# Patient Record
Sex: Male | Born: 1975 | Race: White | Hispanic: No | Marital: Single | Smoking: Current every day smoker
Health system: Southern US, Community
[De-identification: ages and names within clinical notes are randomized; demographics above are authoritative.]

## PROBLEM LIST (undated history)

## (undated) DIAGNOSIS — E1110 Type 2 diabetes mellitus with ketoacidosis without coma: Secondary | ICD-10-CM

## (undated) DIAGNOSIS — E1165 Type 2 diabetes mellitus with hyperglycemia: Secondary | ICD-10-CM

## (undated) DIAGNOSIS — Z789 Other specified health status: Secondary | ICD-10-CM

## (undated) DIAGNOSIS — F1010 Alcohol abuse, uncomplicated: Secondary | ICD-10-CM

## (undated) HISTORY — DX: Type 2 diabetes mellitus with ketoacidosis without coma: E11.10

## (undated) HISTORY — PX: ANKLE SURGERY: SHX546

---

## 2015-11-24 ENCOUNTER — Ambulatory Visit: Admission: EM | Admit: 2015-11-24 | Discharge: 2015-11-24 | Discharge status: Absent without leave | Payer: Self-pay

## 2017-05-17 ENCOUNTER — Ambulatory Visit: Payer: Self-pay | Admitting: Family Medicine

## 2017-05-18 ENCOUNTER — Ambulatory Visit: Payer: Self-pay | Admitting: Family Medicine

## 2018-09-03 ENCOUNTER — Other Ambulatory Visit: Payer: Self-pay

## 2018-09-03 ENCOUNTER — Encounter: Payer: Self-pay | Admitting: Internal Medicine

## 2018-09-03 ENCOUNTER — Emergency Department: Payer: Self-pay

## 2018-09-03 ENCOUNTER — Inpatient Hospital Stay
Admission: EM | Admit: 2018-09-03 | Discharge: 2018-09-06 | DRG: 638 | Disposition: A | Discharge status: Home / Self Care | Payer: Self-pay | Attending: Internal Medicine | Admitting: Internal Medicine

## 2018-09-03 DIAGNOSIS — E8881 Metabolic syndrome: Secondary | ICD-10-CM | POA: Diagnosis present

## 2018-09-03 DIAGNOSIS — K760 Fatty (change of) liver, not elsewhere classified: Secondary | ICD-10-CM | POA: Diagnosis present

## 2018-09-03 DIAGNOSIS — F1010 Alcohol abuse, uncomplicated: Secondary | ICD-10-CM | POA: Diagnosis present

## 2018-09-03 DIAGNOSIS — R112 Nausea with vomiting, unspecified: Secondary | ICD-10-CM | POA: Diagnosis present

## 2018-09-03 DIAGNOSIS — R739 Hyperglycemia, unspecified: Secondary | ICD-10-CM | POA: Diagnosis present

## 2018-09-03 DIAGNOSIS — E785 Hyperlipidemia, unspecified: Secondary | ICD-10-CM | POA: Diagnosis present

## 2018-09-03 DIAGNOSIS — I2510 Atherosclerotic heart disease of native coronary artery without angina pectoris: Secondary | ICD-10-CM | POA: Diagnosis present

## 2018-09-03 DIAGNOSIS — E876 Hypokalemia: Secondary | ICD-10-CM | POA: Diagnosis present

## 2018-09-03 DIAGNOSIS — Z23 Encounter for immunization: Secondary | ICD-10-CM

## 2018-09-03 DIAGNOSIS — R748 Abnormal levels of other serum enzymes: Secondary | ICD-10-CM

## 2018-09-03 DIAGNOSIS — R740 Nonspecific elevation of levels of transaminase and lactic acid dehydrogenase [LDH]: Secondary | ICD-10-CM

## 2018-09-03 DIAGNOSIS — F1021 Alcohol dependence, in remission: Secondary | ICD-10-CM | POA: Diagnosis present

## 2018-09-03 DIAGNOSIS — E1110 Type 2 diabetes mellitus with ketoacidosis without coma: Principal | ICD-10-CM | POA: Diagnosis present

## 2018-09-03 DIAGNOSIS — Z6841 Body Mass Index (BMI) 40.0 and over, adult: Secondary | ICD-10-CM

## 2018-09-03 DIAGNOSIS — R1013 Epigastric pain: Secondary | ICD-10-CM

## 2018-09-03 DIAGNOSIS — R7401 Elevation of levels of liver transaminase levels: Secondary | ICD-10-CM | POA: Diagnosis present

## 2018-09-03 DIAGNOSIS — I10 Essential (primary) hypertension: Secondary | ICD-10-CM | POA: Diagnosis present

## 2018-09-03 DIAGNOSIS — Z87891 Personal history of nicotine dependence: Secondary | ICD-10-CM

## 2018-09-03 DIAGNOSIS — E872 Acidosis: Secondary | ICD-10-CM | POA: Diagnosis present

## 2018-09-03 DIAGNOSIS — F419 Anxiety disorder, unspecified: Secondary | ICD-10-CM | POA: Diagnosis present

## 2018-09-03 HISTORY — DX: Alcohol abuse, uncomplicated: F10.10

## 2018-09-03 HISTORY — DX: Type 2 diabetes mellitus with hyperglycemia: E11.65

## 2018-09-03 HISTORY — DX: Other specified health status: Z78.9

## 2018-09-03 HISTORY — DX: Morbid (severe) obesity due to excess calories: E66.01

## 2018-09-03 LAB — BASIC METABOLIC PANEL
ANION GAP: 21 — AB (ref 5–15)
BUN: <5 mg/dL — ABNORMAL LOW (ref 6–20)
CALCIUM: 10 mg/dL (ref 8.9–10.3)
CO2: 24 mmol/L (ref 22–32)
Chloride: 90 mmol/L — ABNORMAL LOW (ref 98–111)
Creatinine, Ser: 0.59 mg/dL — ABNORMAL LOW (ref 0.61–1.24)
GFR calc Af Amer: >60 mL/min (ref >60)
Glucose, Bld: 343 mg/dL — ABNORMAL HIGH (ref 70–99)
POTASSIUM: 2.7 mmol/L — AB (ref 3.5–5.1)
SODIUM: 135 mmol/L (ref 135–145)

## 2018-09-03 LAB — BLOOD GAS, VENOUS
ACID-BASE EXCESS: 3.9 mmol/L — AB (ref 0.0–2.0)
BICARBONATE: 26.1 mmol/L (ref 20.0–28.0)
FIO2: 0.21
O2 Saturation: 91.9 %
PCO2 VEN: 32 mmHg — AB (ref 44.0–60.0)
PH VEN: 7.52 — AB (ref 7.250–7.430)
Patient temperature: 37
pO2, Ven: 56 mmHg — ABNORMAL HIGH (ref 32.0–45.0)

## 2018-09-03 LAB — URINALYSIS, COMPLETE (UACMP) WITH MICROSCOPIC
Bacteria, UA: NONE SEEN
Bilirubin Urine: NEGATIVE
Glucose, UA: >=500 mg/dL — AB
Ketones, ur: 20 mg/dL — AB
Leukocytes, UA: NEGATIVE
NITRITE: NEGATIVE
Protein, ur: 30 mg/dL — AB
Specific Gravity, Urine: 1.029 (ref 1.005–1.030)
pH: 7 (ref 5.0–8.0)

## 2018-09-03 LAB — CBC
HCT: 47.6 % (ref 39.0–52.0)
HEMOGLOBIN: 16.7 g/dL (ref 13.0–17.0)
MCH: 31.5 pg (ref 26.0–34.0)
MCHC: 35.1 g/dL (ref 30.0–36.0)
MCV: 89.8 fL (ref 80.0–100.0)
Platelets: 152 10*3/uL (ref 150–400)
RBC: 5.3 MIL/uL (ref 4.22–5.81)
RDW: 13.5 % (ref 11.5–15.5)
WBC: 7.5 10*3/uL (ref 4.0–10.5)
nRBC: 0 % (ref 0.0–0.2)

## 2018-09-03 LAB — HEPATIC FUNCTION PANEL
ALBUMIN: 4.1 g/dL (ref 3.5–5.0)
ALT: 117 U/L — ABNORMAL HIGH (ref 0–44)
AST: 229 U/L — AB (ref 15–41)
Alkaline Phosphatase: 83 U/L (ref 38–126)
Bilirubin, Direct: 1.4 mg/dL — ABNORMAL HIGH (ref 0.0–0.2)
Indirect Bilirubin: 1.2 mg/dL — ABNORMAL HIGH (ref 0.3–0.9)
TOTAL PROTEIN: 8.6 g/dL — AB (ref 6.5–8.1)
Total Bilirubin: 2.6 mg/dL — ABNORMAL HIGH (ref 0.3–1.2)

## 2018-09-03 LAB — GLUCOSE, CAPILLARY
GLUCOSE-CAPILLARY: 321 mg/dL — AB (ref 70–99)
Glucose-Capillary: 272 mg/dL — ABNORMAL HIGH (ref 70–99)

## 2018-09-03 LAB — LIPASE, BLOOD: Lipase: 64 U/L — ABNORMAL HIGH (ref 11–51)

## 2018-09-03 LAB — CG4 I-STAT (LACTIC ACID): Lactic Acid, Venous: 5.51 mmol/L (ref 0.5–1.9)

## 2018-09-03 MED ORDER — ONDANSETRON HCL 4 MG/2ML IJ SOLN
INTRAMUSCULAR | Status: AC
  Administered 2018-09-03: 4 mg via INTRAVENOUS
  Filled 2018-09-03: qty 2

## 2018-09-03 MED ORDER — LORAZEPAM 2 MG/ML IJ SOLN
1.0000 mg | Freq: Once | INTRAMUSCULAR | Status: AC
  Administered 2018-09-03: 1 mg via INTRAVENOUS
  Filled 2018-09-03: qty 1

## 2018-09-03 MED ORDER — THIAMINE HCL 100 MG/ML IJ SOLN
Freq: Once | INTRAVENOUS | Status: AC
  Administered 2018-09-03: 23:00:00 via INTRAVENOUS
  Filled 2018-09-03: qty 1000

## 2018-09-03 MED ORDER — LORAZEPAM 2 MG/ML IJ SOLN
2.0000 mg | Freq: Once | INTRAMUSCULAR | Status: AC
  Administered 2018-09-03: 2 mg via INTRAVENOUS

## 2018-09-03 MED ORDER — POTASSIUM CHLORIDE CRYS ER 20 MEQ PO TBCR
40.0000 meq | EXTENDED_RELEASE_TABLET | Freq: Once | ORAL | Status: AC
  Administered 2018-09-03: 40 meq via ORAL
  Filled 2018-09-03: qty 2

## 2018-09-03 MED ORDER — ONDANSETRON HCL 4 MG/2ML IJ SOLN
4.0000 mg | Freq: Once | INTRAMUSCULAR | Status: AC
  Administered 2018-09-03: 4 mg via INTRAVENOUS

## 2018-09-03 MED ORDER — SODIUM CHLORIDE 0.9 % IV BOLUS
500.0000 mL | Freq: Once | INTRAVENOUS | Status: AC
  Administered 2018-09-03: 500 mL via INTRAVENOUS

## 2018-09-03 MED ORDER — SODIUM CHLORIDE 0.9 % IV BOLUS
1000.0000 mL | Freq: Once | INTRAVENOUS | Status: AC
  Administered 2018-09-03: 1000 mL via INTRAVENOUS

## 2018-09-03 MED ORDER — LORAZEPAM 2 MG/ML IJ SOLN
INTRAMUSCULAR | Status: AC
  Filled 2018-09-03: qty 1

## 2018-09-03 NOTE — H&P (Signed)
[facility] [place] at [facility]   PATIENT NAME: Cory MoralesRyan Snyder    MR#:  [id number]  DATE OF BIRTH:  1976-07-26  DATE OF ADMISSION:  09/03/2018  PRIMARY CARE PHYSICIAN: Patient, No Pcp Per   REQUESTING/REFERRING PHYSICIAN: Derrill KayGoodman, MD  CHIEF COMPLAINT:   Chief Complaint  Patient presents with  . Hyperglycemia  . Emesis    HISTORY OF PRESENT ILLNESS:  Cory MoralesRyan Snyder  is a 42 y.o. male who presents with chief complaint as above.  Patient states he has been having nausea with vomiting for the past 2 weeks.  He states he was diagnosed with diabetes about 2 months ago, but has not been able to establish with a PCP yet and so ran out of the initial metformin he was given about 4 weeks ago.  His nausea with vomiting has gotten significant he worse over the past 2 days.  He is also a heavy drinker, drinking about 1/5 of alcohol every day.  His last drink was yesterday.  In the ED tonight he was found to be hyperglycemic, has elevated transaminases, hypokalemic, and with an elevated lactic acid.  Infectious work-up is largely unrevealing.  Hospitalist were called for admission  PAST MEDICAL HISTORY:   Past Medical History:  Diagnosis Date  . Patient denies medical problems      PAST SURGICAL HISTORY:   Past Surgical History:  Procedure Laterality Date  . ANKLE SURGERY       SOCIAL HISTORY:   Social History   Tobacco Use  . Smoking status: Former Smoker  Substance Use Topics  . Alcohol use: Yes    Comment: 1/5 daily     FAMILY HISTORY:   Family History  Problem Relation Age of Onset  . CAD Other      DRUG ALLERGIES:  No Known Allergies  MEDICATIONS AT HOME:   Prior to Admission medications   Not on File    REVIEW OF SYSTEMS:  Review of Systems  Constitutional: Positive for malaise/fatigue. Negative for chills, fever and weight loss.  HENT: Negative for ear pain, hearing loss and tinnitus.   Eyes: Negative for blurred vision,  double vision, pain and redness.  Respiratory: Negative for cough, hemoptysis and shortness of breath.   Cardiovascular: Negative for chest pain, palpitations, orthopnea and leg swelling.  Gastrointestinal: Positive for nausea and vomiting. Negative for abdominal pain, constipation and diarrhea.  Genitourinary: Negative for dysuria, frequency and hematuria.  Musculoskeletal: Negative for back pain, joint pain and neck pain.  Skin:       No acne, rash, or lesions  Neurological: Negative for dizziness, tremors, focal weakness and weakness.  Endo/Heme/Allergies: Negative for polydipsia. Does not bruise/bleed easily.  Psychiatric/Behavioral: Negative for depression. The patient is not nervous/anxious and does not have insomnia.      VITAL SIGNS:   Vitals:   09/03/18 1827 09/03/18 1900 09/03/18 2000 09/03/18 2330  BP:  (!) 160/83 (!) 152/95 (!) 168/88  Pulse:  (!) 102 (!) 108 65  Resp:      Temp:      TempSrc:      SpO2:  92% 97% 93%  Weight: (!) 145.2 kg     Height: 6' (1.829 m)      Wt Readings from Last 3 Encounters:  09/03/18 (!) 145.2 kg    PHYSICAL EXAMINATION:  Physical Exam  Vitals reviewed. Constitutional: He is oriented to person, place, and time. He appears well-developed and well-nourished. No distress.  HENT:  Head: Normocephalic and  atraumatic.  Mouth/Throat: Oropharynx is clear and moist.  Eyes: Pupils are equal, round, and reactive to light. Conjunctivae and EOM are normal. No scleral icterus.  Neck: Normal range of motion. Neck supple. No JVD present. No thyromegaly present.  Cardiovascular: Regular rhythm and intact distal pulses. Exam reveals no gallop and no friction rub.  No murmur heard. Tachycardic  Respiratory: Effort normal and breath sounds normal. No respiratory distress. He has no wheezes. He has no rales.  GI: Soft. Bowel sounds are normal. He exhibits no distension. There is tenderness.  Musculoskeletal: Normal range of motion. He exhibits no  edema.  No arthritis, no gout  Lymphadenopathy:    He has no cervical adenopathy.  Neurological: He is alert and oriented to person, place, and time. No cranial nerve deficit.  No dysarthria, no aphasia  Skin: Skin is warm and dry. No rash noted. No erythema.  Psychiatric: He has a normal mood and affect. His behavior is normal. Judgment and thought content normal.    LABORATORY PANEL:   CBC Recent Labs  Lab 09/03/18 1833  WBC 7.5  HGB 16.7  HCT 47.6  PLT 152   ------------------------------------------------------------------------------------------------------------------  Chemistries  Recent Labs  Lab 09/03/18 1833  NA 135  K 2.7*  CL 90*  CO2 24  GLUCOSE 343*  BUN <5*  CREATININE 0.59*  CALCIUM 10.0  AST 229*  ALT 117*  ALKPHOS 83  BILITOT 2.6*   ------------------------------------------------------------------------------------------------------------------  Cardiac Enzymes No results for input(s): TROPONINI in the last 168 hours. ------------------------------------------------------------------------------------------------------------------  RADIOLOGY:  US Abdomen Limited Ruq  Result Date: 09/03/2018 CLINICAL DATA:  Epigastric pain EXAM: ULTRASOUND ABDOMEN LIMITED RIGHT UPPER QUADRANT COMPARISON:  None. FINDINGS: Gallbladder: No gallstones or wall thickening visualized. No sonographic Murphy sign noted by sonographer. Common bile duct: Diameter: Normal caliber, 4 mm Liver: Increased echotexture compatible with fatty infiltration. No focal abnormality or biliary ductal dilatation. Portal vein is patent on color Doppler imaging with normal direction of blood flow towards the liver. IMPRESSION: Fatty infiltration of the liver.  No acute findings. Electronically Signed   By: Charlett Nose M.D.   On: 09/03/2018 22:17    EKG:   Orders placed or performed during the hospital encounter of 09/03/18  . EKG 12-Lead  . EKG 12-Lead  . ED EKG  . ED EKG     IMPRESSION AND PLAN:  Principal Problem:   DKA (diabetic ketoacidoses) (HCC) -admit to stepdown unit with IV insulin and corresponding orders as per DKA order set Active Problems:   Transaminitis -AST to ALT ratio around 2-1.  Suspect his transaminitis is due to his alcohol use, but given that he has had some frequent nausea and vomiting recently we will also check an acute hepatitis panel   Nausea & vomiting -some suspicion that this could be related to gastroparesis.  However, we will check hepatitis panel as above.  PRN antiemetics   Hypokalemia -replace potassium and monitor   Alcohol abuse -CIWA protocol  Chart review performed and case discussed with ED provider. Labs, imaging and/or ECG reviewed by provider and discussed with patient/family. Management plans discussed with the patient and/or family.  DVT PROPHYLAXIS: SubQ lovenox   GI PROPHYLAXIS:  None  ADMISSION STATUS: Inpatient     CODE STATUS: Full  TOTAL TIME TAKING CARE OF THIS PATIENT: 45 minutes.   Carrin Vannostrand FIELDING 09/03/2018, 11:52 PM  [employer]  [phone]  CC: Primary care physician; Patient, No Pcp Per  Note:  This document  was prepared using Systems analyst and may include unintentional dictation errors.

## 2018-09-03 NOTE — ED Triage Notes (Signed)
Pt arrived via POV with reports of several days of poor appetite, vomiting and increased thirst. Pt recent dx diabetic, not currently on meds.   Pt diaphoretic in triage.  C/o not feeling well, wife at bedside.

## 2018-09-03 NOTE — ED Notes (Signed)
Date and time results received: 09/03/18 1910 (use smartphrase ".now" to insert current time)  Test: Potassium Critical Value: 2.7  Name of Provider Notified: Derrill KayGoodman   Orders Received? Or Actions Taken?: No orders received

## 2018-09-03 NOTE — ED Notes (Signed)
Pt reports feeling like he is going to pass out, pt profusely diaphoretic, wife fanning pt, IV access established and fluids to be started.

## 2018-09-03 NOTE — ED Notes (Signed)
N/V/D PTA. Denies symptoms at this time.

## 2018-09-03 NOTE — ED Provider Notes (Signed)
[facility] Emergency Department Provider Note  ____________________________________________   I have reviewed the triage vital signs and the nursing notes.   HISTORY  Chief Complaint Hyperglycemia and Emesis   History limited by: Not Limited   HPI Cory MoralesRyan Snyder is a 42 y.o. male who presents to the emergency department today because of concerns for feeling unwell, nausea vomiting and abdominal pain.  He states his symptoms have been going on for the past couple of weeks.  He states the abdominal pain is located in the central and right upper abdomen.  He has multiple episodes of vomiting each day.  This has been nonbloody.  He does state that he has a significant alcohol use.  Drinks roughly 1/5 a day.  He denies any fevers.  Was recently diagnosed with diabetes but is been out of his metformin for the past month.   Per medical record review patient has a history of diabetes  No past medical history on file.  There are no active problems to display for this patient.   Prior to Admission medications   Not on File    Allergies Patient has no known allergies.  No family history on file.  Social History Social History   Tobacco Use  . Smoking status: Not on file  Substance Use Topics  . Alcohol use: Not on file  . Drug use: Not on file    Review of Systems Constitutional: No fever/chills Eyes: No visual changes. ENT: No sore throat. Cardiovascular: Denies chest pain. Respiratory: Denies shortness of breath. Gastrointestinal: Positive for abdominal pain, nausea and vomiting.  Genitourinary: Negative for dysuria. Musculoskeletal: Negative for back pain. Skin: Negative for rash. Neurological: Negative for headaches, focal weakness or numbness.  ____________________________________________   PHYSICAL EXAM:  VITAL SIGNS: ED Triage Vitals  Enc Vitals Group     BP 09/03/18 1826 (!) 160/84     Pulse Rate 09/03/18 1826 64     Resp  09/03/18 1826 (!) 22     Temp 09/03/18 1826 (!) 97.5 F (36.4 C)     Temp Source 09/03/18 1826 Oral     SpO2 09/03/18 1826 97 %     Weight 09/03/18 1827 (!) 320 lb (145.2 kg)     Height 09/03/18 1827 6' (1.829 m)     Head Circumference --      Peak Flow --      Pain Score 09/03/18 1827 0   Constitutional: Alert and oriented.  Eyes: Conjunctivae are normal.  ENT      Head: Normocephalic and atraumatic.      Nose: No congestion/rhinnorhea.      Mouth/Throat: Mucous membranes are moist.      Neck: No stridor. Hematological/Lymphatic/Immunilogical: No cervical lymphadenopathy. Cardiovascular: Normal rate, regular rhythm.  No murmurs, rubs, or gallops.  Respiratory: Normal respiratory effort without tachypnea nor retractions. Breath sounds are clear and equal bilaterally. No wheezes/rales/rhonchi. Gastrointestinal: Soft and non tender. No rebound. No guarding.  Genitourinary: Deferred Musculoskeletal: Normal range of motion in all extremities. No lower extremity edema. Neurologic:  Normal speech and language. No gross focal neurologic deficits are appreciated.  Skin:  Skin is warm, dry and intact. No rash noted. Psychiatric: Mood and affect are normal. Speech and behavior are normal. Patient exhibits appropriate insight and judgment.  ____________________________________________    LABS (pertinent positives/negatives)  CBC wbc 7.5, hgb 16.7, plt 152 VBG pH 7.52 I stat lactic 5.51 BMP na 135, k 2.7, glu 343, bun <5, cr 0.59, anion gap  21 ____________________________________________   EKG  IPhineas Semen, attending physician, personally viewed and interpreted this EKG  EKG Time: 1820 Rate: 118 Rhythm: sinus tachycardia with pac Axis: normal Intervals: qtc 518 QRS: q waves III ST changes: no st elevation Impression: abnormal ekg  ____________________________________________    RADIOLOGY  [place] RUQ Fatty  liver  ____________________________________________   PROCEDURES  Procedures  ____________________________________________   INITIAL IMPRESSION / ASSESSMENT AND PLAN / ED COURSE  Pertinent labs & imaging results that were available during my care of the patient were reviewed by me and considered in my medical decision making (see chart for details).   Patient presented to the emergency department today because of concern for feeling unwell nausea and abdominal pain. Patient has somewhat recent diagnosis of diabetes but currently is not on medication. Patient blood work shows elevated lactic, blood glucose and anion gap. At this point doubt DKA given alkalosis. However patient's potassium is low and will need repletion prior to initiating any insulin. Patient does state he is a heavy drinker and this could be causing some of the blood abnormalities. His LFTs were elevated. Doubt sepsis at this time given lack of any infectious source. Will plan on admission. ___________________________________   FINAL CLINICAL IMPRESSION(S) / ED DIAGNOSES  Final diagnoses:  Epigastric pain  Elevated liver enzymes     Note: This dictation was prepared with Dragon dictation. Any transcriptional errors that result from this process are unintentional     Phineas Semen, MD 09/04/18 1521

## 2018-09-04 ENCOUNTER — Encounter: Payer: Self-pay | Admitting: Adult Health

## 2018-09-04 ENCOUNTER — Telehealth: Payer: Self-pay | Admitting: Pharmacy Technician

## 2018-09-04 DIAGNOSIS — E1110 Type 2 diabetes mellitus with ketoacidosis without coma: Secondary | ICD-10-CM | POA: Diagnosis present

## 2018-09-04 DIAGNOSIS — E1111 Type 2 diabetes mellitus with ketoacidosis with coma: Secondary | ICD-10-CM

## 2018-09-04 HISTORY — DX: Type 2 diabetes mellitus with ketoacidosis without coma: E11.10

## 2018-09-04 LAB — BASIC METABOLIC PANEL
Anion gap: 13 (ref 5–15)
Anion gap: 11 (ref 5–15)
CHLORIDE: 95 mmol/L — AB (ref 98–111)
CO2: 27 mmol/L (ref 22–32)
CO2: 28 mmol/L (ref 22–32)
CREATININE: 0.47 mg/dL — AB (ref 0.61–1.24)
CREATININE: 0.53 mg/dL — AB (ref 0.61–1.24)
Calcium: 8.6 mg/dL — ABNORMAL LOW (ref 8.9–10.3)
Calcium: 8.6 mg/dL — ABNORMAL LOW (ref 8.9–10.3)
Chloride: 96 mmol/L — ABNORMAL LOW (ref 98–111)
GFR calc Af Amer: >60 mL/min (ref >60)
GFR calc Af Amer: >60 mL/min (ref >60)
Glucose, Bld: 242 mg/dL — ABNORMAL HIGH (ref 70–99)
Glucose, Bld: 217 mg/dL — ABNORMAL HIGH (ref 70–99)
POTASSIUM: 2.7 mmol/L — AB (ref 3.5–5.1)
Potassium: 2.7 mmol/L — CL (ref 3.5–5.1)
SODIUM: 135 mmol/L (ref 135–145)
SODIUM: 135 mmol/L (ref 135–145)

## 2018-09-04 LAB — LIPID PANEL
CHOL/HDL RATIO: 4.1 ratio
Cholesterol: 330 mg/dL — ABNORMAL HIGH (ref 0–200)
HDL: 80 mg/dL (ref >40)
LDL Cholesterol: 220 mg/dL — ABNORMAL HIGH (ref 0–99)
Triglycerides: 150 mg/dL — ABNORMAL HIGH (ref <150)
VLDL: 30 mg/dL (ref 0–40)

## 2018-09-04 LAB — COMPREHENSIVE METABOLIC PANEL
ALT: 96 U/L — AB (ref 0–44)
AST: 178 U/L — ABNORMAL HIGH (ref 15–41)
Albumin: 3.7 g/dL (ref 3.5–5.0)
Alkaline Phosphatase: 70 U/L (ref 38–126)
Anion gap: 18 — ABNORMAL HIGH (ref 5–15)
BUN: <5 mg/dL — ABNORMAL LOW (ref 6–20)
CHLORIDE: 93 mmol/L — AB (ref 98–111)
CO2: 24 mmol/L (ref 22–32)
CREATININE: 0.5 mg/dL — AB (ref 0.61–1.24)
Calcium: 9 mg/dL (ref 8.9–10.3)
GFR calc non Af Amer: >60 mL/min (ref >60)
Glucose, Bld: 250 mg/dL — ABNORMAL HIGH (ref 70–99)
Potassium: 2.9 mmol/L — ABNORMAL LOW (ref 3.5–5.1)
SODIUM: 135 mmol/L (ref 135–145)
Total Bilirubin: 3.1 mg/dL — ABNORMAL HIGH (ref 0.3–1.2)
Total Protein: 7.5 g/dL (ref 6.5–8.1)

## 2018-09-04 LAB — CBC
HCT: 42.8 % (ref 39.0–52.0)
HEMOGLOBIN: 14.6 g/dL (ref 13.0–17.0)
MCH: 31.3 pg (ref 26.0–34.0)
MCHC: 34.1 g/dL (ref 30.0–36.0)
MCV: 91.8 fL (ref 80.0–100.0)
Platelets: 103 10*3/uL — ABNORMAL LOW (ref 150–400)
RBC: 4.66 MIL/uL (ref 4.22–5.81)
RDW: 13.7 % (ref 11.5–15.5)
WBC: 6.3 10*3/uL (ref 4.0–10.5)
nRBC: 0 % (ref 0.0–0.2)

## 2018-09-04 LAB — DIFFERENTIAL
BAND NEUTROPHILS: 0 %
Basophils Absolute: 0.1 10*3/uL (ref 0.0–0.1)
Basophils Relative: 1 %
Blasts: 0 %
EOS ABS: 0.2 10*3/uL (ref 0.0–0.5)
Eosinophils Relative: 2 %
LYMPHS PCT: 15 %
Lymphs Abs: 1.1 10*3/uL (ref 0.7–4.0)
METAMYELOCYTES PCT: 0 %
MONO ABS: 0.8 10*3/uL (ref 0.1–1.0)
Monocytes Relative: 11 %
Myelocytes: 0 %
NEUTROS ABS: 5.3 10*3/uL (ref 1.7–7.7)
NRBC: 0 /100{WBCs}
Neutrophils Relative %: 71 %
Other: 0 %
Promyelocytes Relative: 0 %

## 2018-09-04 LAB — POTASSIUM: Potassium: 3.1 mmol/L — ABNORMAL LOW (ref 3.5–5.1)

## 2018-09-04 LAB — GLUCOSE, CAPILLARY
GLUCOSE-CAPILLARY: 211 mg/dL — AB (ref 70–99)
GLUCOSE-CAPILLARY: 245 mg/dL — AB (ref 70–99)
GLUCOSE-CAPILLARY: 255 mg/dL — AB (ref 70–99)
Glucose-Capillary: 237 mg/dL — ABNORMAL HIGH (ref 70–99)
Glucose-Capillary: 205 mg/dL — ABNORMAL HIGH (ref 70–99)
Glucose-Capillary: 241 mg/dL — ABNORMAL HIGH (ref 70–99)
Glucose-Capillary: 186 mg/dL — ABNORMAL HIGH (ref 70–99)
Glucose-Capillary: 170 mg/dL — ABNORMAL HIGH (ref 70–99)
Glucose-Capillary: 271 mg/dL — ABNORMAL HIGH (ref 70–99)

## 2018-09-04 LAB — MRSA PCR SCREENING: MRSA by PCR: NEGATIVE

## 2018-09-04 LAB — MAGNESIUM: Magnesium: 1.3 mg/dL — ABNORMAL LOW (ref 1.7–2.4)

## 2018-09-04 LAB — PHOSPHORUS: Phosphorus: 1.7 mg/dL — ABNORMAL LOW (ref 2.5–4.6)

## 2018-09-04 LAB — LACTIC ACID, PLASMA
LACTIC ACID, VENOUS: 1.3 mmol/L (ref 0.5–1.9)
LACTIC ACID, VENOUS: 3 mmol/L — AB (ref 0.5–1.9)
Lactic Acid, Venous: 1.4 mmol/L (ref 0.5–1.9)

## 2018-09-04 LAB — HEMOGLOBIN A1C
HEMOGLOBIN A1C: 10.6 % — AB (ref 4.8–5.6)
MEAN PLASMA GLUCOSE: 257.52 mg/dL

## 2018-09-04 MED ORDER — SODIUM CHLORIDE 0.9 % IV SOLN: INTRAVENOUS | Status: AC

## 2018-09-04 MED ORDER — IBUPROFEN 400 MG PO TABS: 400.0000 mg | ORAL_TABLET | Freq: Four times a day (QID) | ORAL | Status: DC | PRN

## 2018-09-04 MED ORDER — DEXTROSE-NACL 5-0.45 % IV SOLN
INTRAVENOUS | Status: DC
  Administered 2018-09-04 (×3): via INTRAVENOUS

## 2018-09-04 MED ORDER — MAGNESIUM SULFATE 2 GM/50ML IV SOLN
2.0000 g | Freq: Once | INTRAVENOUS | Status: AC
  Administered 2018-09-04: 2 g via INTRAVENOUS
  Filled 2018-09-04: qty 50

## 2018-09-04 MED ORDER — SODIUM CHLORIDE 0.9 % IV SOLN
INTRAVENOUS | Status: AC
  Administered 2018-09-04: 02:00:00 via INTRAVENOUS

## 2018-09-04 MED ORDER — INSULIN GLARGINE 100 UNIT/ML ~~LOC~~ SOLN
10.0000 [IU] | Freq: Two times a day (BID) | SUBCUTANEOUS | Status: DC
  Administered 2018-09-04 – 2018-09-05 (×4): 10 [IU] via SUBCUTANEOUS
  Filled 2018-09-04 (×6): qty 0.1

## 2018-09-04 MED ORDER — INSULIN ASPART 100 UNIT/ML ~~LOC~~ SOLN: 0.0000 [IU] | Freq: Every day | SUBCUTANEOUS | Status: DC

## 2018-09-04 MED ORDER — LORAZEPAM 1 MG PO TABS
0.0000 mg | ORAL_TABLET | Freq: Four times a day (QID) | ORAL | Status: AC
  Administered 2018-09-05 (×2): 1 mg via ORAL
  Filled 2018-09-04 (×2): qty 1

## 2018-09-04 MED ORDER — INFLUENZA VAC SPLIT QUAD 0.5 ML IM SUSY
0.5000 mL | PREFILLED_SYRINGE | INTRAMUSCULAR | Status: AC
  Administered 2018-09-05: 0.5 mL via INTRAMUSCULAR
  Filled 2018-09-04: qty 0.5

## 2018-09-04 MED ORDER — INSULIN ASPART 100 UNIT/ML ~~LOC~~ SOLN
0.0000 [IU] | Freq: Three times a day (TID) | SUBCUTANEOUS | Status: DC
  Administered 2018-09-04: 5 [IU] via SUBCUTANEOUS
  Administered 2018-09-05: 1 [IU] via SUBCUTANEOUS
  Administered 2018-09-05 – 2018-09-06 (×3): 2 [IU] via SUBCUTANEOUS
  Administered 2018-09-06: 1 [IU] via SUBCUTANEOUS
  Filled 2018-09-04 (×6): qty 1

## 2018-09-04 MED ORDER — LORAZEPAM 1 MG PO TABS
1.0000 mg | ORAL_TABLET | Freq: Four times a day (QID) | ORAL | Status: DC | PRN
  Administered 2018-09-04 – 2018-09-05 (×3): 1 mg via ORAL
  Filled 2018-09-04 (×3): qty 1

## 2018-09-04 MED ORDER — FOLIC ACID 1 MG PO TABS
1.0000 mg | ORAL_TABLET | Freq: Every day | ORAL | Status: DC
  Administered 2018-09-04 – 2018-09-06 (×3): 1 mg via ORAL
  Filled 2018-09-04 (×3): qty 1

## 2018-09-04 MED ORDER — VITAMIN B-1 100 MG PO TABS
100.0000 mg | ORAL_TABLET | Freq: Every day | ORAL | Status: DC
  Administered 2018-09-04 – 2018-09-06 (×3): 100 mg via ORAL
  Filled 2018-09-04 (×3): qty 1

## 2018-09-04 MED ORDER — THIAMINE HCL 100 MG/ML IJ SOLN
100.0000 mg | Freq: Every day | INTRAMUSCULAR | Status: DC
  Filled 2018-09-04: qty 1

## 2018-09-04 MED ORDER — POTASSIUM PHOSPHATES 15 MMOLE/5ML IV SOLN
15.0000 mmol | Freq: Once | INTRAVENOUS | Status: AC
  Administered 2018-09-04: 15 mmol via INTRAVENOUS
  Filled 2018-09-04: qty 5

## 2018-09-04 MED ORDER — METOPROLOL TARTRATE 25 MG PO TABS
12.5000 mg | ORAL_TABLET | Freq: Two times a day (BID) | ORAL | Status: DC
  Administered 2018-09-04 – 2018-09-06 (×5): 12.5 mg via ORAL
  Filled 2018-09-04 (×5): qty 1

## 2018-09-04 MED ORDER — LIVING WELL WITH DIABETES BOOK
Freq: Once | Status: AC
  Administered 2018-09-04: 14:00:00
  Filled 2018-09-04: qty 1

## 2018-09-04 MED ORDER — ENOXAPARIN SODIUM 40 MG/0.4ML ~~LOC~~ SOLN
40.0000 mg | Freq: Two times a day (BID) | SUBCUTANEOUS | Status: DC
  Administered 2018-09-04 – 2018-09-06 (×2): 40 mg via SUBCUTANEOUS
  Filled 2018-09-04 (×3): qty 0.4

## 2018-09-04 MED ORDER — METOCLOPRAMIDE HCL 5 MG/ML IJ SOLN
5.0000 mg | Freq: Four times a day (QID) | INTRAMUSCULAR | Status: DC | PRN
  Administered 2018-09-04: 5 mg via INTRAVENOUS
  Filled 2018-09-04: qty 2

## 2018-09-04 MED ORDER — PNEUMOCOCCAL VAC POLYVALENT 25 MCG/0.5ML IJ INJ
0.5000 mL | INJECTION | INTRAMUSCULAR | Status: AC
  Administered 2018-09-05: 0.5 mL via INTRAMUSCULAR
  Filled 2018-09-04: qty 0.5

## 2018-09-04 MED ORDER — ADULT MULTIVITAMIN W/MINERALS CH
1.0000 | ORAL_TABLET | Freq: Every day | ORAL | Status: DC
  Administered 2018-09-04 – 2018-09-06 (×3): 1 via ORAL
  Filled 2018-09-04 (×3): qty 1

## 2018-09-04 MED ORDER — ONDANSETRON HCL 4 MG PO TABS: 4.0000 mg | ORAL_TABLET | Freq: Four times a day (QID) | ORAL | Status: DC | PRN

## 2018-09-04 MED ORDER — LORAZEPAM 1 MG PO TABS: 0.0000 mg | ORAL_TABLET | Freq: Two times a day (BID) | ORAL | Status: DC

## 2018-09-04 MED ORDER — ASPIRIN EC 81 MG PO TBEC
81.0000 mg | DELAYED_RELEASE_TABLET | Freq: Every day | ORAL | Status: DC
  Administered 2018-09-04 – 2018-09-06 (×3): 81 mg via ORAL
  Filled 2018-09-04 (×3): qty 1

## 2018-09-04 MED ORDER — POTASSIUM CHLORIDE 10 MEQ/100ML IV SOLN
10.0000 meq | INTRAVENOUS | Status: AC
  Administered 2018-09-04 (×4): 10 meq via INTRAVENOUS
  Filled 2018-09-04 (×4): qty 100

## 2018-09-04 MED ORDER — INSULIN ASPART 100 UNIT/ML ~~LOC~~ SOLN: 0.0000 [IU] | Freq: Three times a day (TID) | SUBCUTANEOUS | Status: DC

## 2018-09-04 MED ORDER — ONDANSETRON HCL 4 MG/2ML IJ SOLN
4.0000 mg | Freq: Four times a day (QID) | INTRAMUSCULAR | Status: DC | PRN
  Administered 2018-09-04: 4 mg via INTRAVENOUS
  Filled 2018-09-04: qty 2

## 2018-09-04 MED ORDER — SODIUM CHLORIDE 0.9 % IV SOLN: INTRAVENOUS | Status: DC

## 2018-09-04 MED ORDER — INSULIN REGULAR(HUMAN) IN NACL 100-0.9 UT/100ML-% IV SOLN
INTRAVENOUS | Status: DC
  Administered 2018-09-04: 1.8 [IU]/h via INTRAVENOUS
  Administered 2018-09-04: 1.9 [IU]/h via INTRAVENOUS
  Filled 2018-09-04: qty 100

## 2018-09-04 MED ORDER — LORAZEPAM 2 MG/ML IJ SOLN
1.0000 mg | Freq: Four times a day (QID) | INTRAMUSCULAR | Status: DC | PRN
  Administered 2018-09-04 – 2018-09-06 (×2): 1 mg via INTRAVENOUS
  Filled 2018-09-04 (×2): qty 1

## 2018-09-04 MED ORDER — POTASSIUM CHLORIDE 20 MEQ PO PACK
40.0000 meq | PACK | Freq: Once | ORAL | Status: AC
  Administered 2018-09-04: 40 meq via ORAL
  Filled 2018-09-04: qty 2

## 2018-09-04 NOTE — Progress Notes (Signed)
PHARMACIST - PHYSICIAN COMMUNICATION  CONCERNING:  Enoxaparin (Lovenox) for DVT Prophylaxis    RECOMMENDATION: Patient was prescribed enoxaprin 40mg  q24 hours for VTE prophylaxis.   Filed Weights   09/03/18 1827 09/04/18 0243  Weight: (!) 320 lb (145.2 kg) (!) 322 lb 15.6 oz (146.5 kg)    Body mass index is 42.61 kg/m.  Estimated Creatinine Clearance: 183 mL/min (A) (by C-G formula based on SCr of 0.5 mg/dL (L)).   Based [facility] patient is candidate for enoxaparin 40mg  every 12 hour dosing due to BMI being >40.  DESCRIPTION: Pharmacy has adjusted enoxaparin dose per [facility] policy.  Patient is now receiving enoxaparin 40mg  every 12 hours.   Gardner CandleSheema M Maleeya Peterkin, PharmD, BCPS Clinical Pharmacist 09/04/2018 2:49 AM

## 2018-09-04 NOTE — Consult Note (Signed)
PULMONARY / CRITICAL CARE MEDICINE  Name: Cory Snyder MRN: [id number] DOB: 1976/05/17    LOS: 1  Referring Provider: Dr. Anne Hahn Reason for Referral: Hypoglycemia Brief patient description: 42 year old male with alcohol abuse and type 2 diabetes presenting with severe hyperglycemia, nausea and vomiting.  HPI: This is a 42 year old male who presented to the ED with complaints of nausea and vomiting x2 weeks.  He was found to be severely hyperglycemic, with severe lactic acidosis.  He drinks heavily every day.  His ED work-up was significant for hyperglycemia with a blood glucose level of 343 mg/dL, elevated LFTs, hypokalemia with a potassium level of 2.7, and a lactic acid level of 5.5.  He is being admitted to the ICU for glycemic control and CIWA monitoring.  Past Medical History:  Diagnosis Date  . Patient denies medical problems    Past Surgical History:  Procedure Laterality Date  . ANKLE SURGERY     Prior to Admission medications   Medication Sig Start Date End Date Taking? Authorizing Provider  amLODipine (NORVASC) 5 MG tablet Take 5 mg by mouth daily.   Yes [provider]  clopidogrel (PLAVIX) 75 MG tablet Take 75 mg by mouth daily.   Yes [provider]  donepezil (ARICEPT) 5 MG tablet Take 1 tablet (5 mg total) by mouth at bedtime. 06/12/18 07/22/18 Yes Sowles, Danna Hefty, MD  empagliflozin (JARDIANCE) 25 MG TABS tablet Take 25 mg by mouth daily.   Yes [provider]  glycopyrrolate (ROBINUL) 1 MG tablet Take 1 mg by mouth 2 (two) times daily.   Yes [provider]  insulin aspart (NOVOLOG FLEXPEN) 100 UNIT/ML FlexPen Inject 12 Units into the skin 2 (two) times daily.   Yes [provider]  insulin aspart (NOVOLOG) 100 UNIT/ML FlexPen Inject 18 Units into the skin daily. At 1700   Yes [provider]  Insulin Degludec-Liraglutide (XULTOPHY) 100-3.6 UNIT-MG/ML SOPN Inject 50 Units into the skin daily.   Yes [provider]  levETIRAcetam (KEPPRA) 500 MG tablet Take 500 mg by mouth 2 (two) times daily.   Yes [provider]  lipase/protease/amylase (CREON) 12000 units CPEP capsule Take 6,000 Units by mouth 3 (three) times daily before meals.   Yes [provider]  lipase/protease/amylase (CREON) 12000 units CPEP capsule Take 3,000 Units by mouth at bedtime. With snack   Yes [provider]  lisinopril (PRINIVIL,ZESTRIL) 5 MG tablet Take 5 mg by mouth daily.   Yes [provider]  metoprolol succinate (TOPROL-XL) 25 MG 24 hr tablet Take 1 tablet (25 mg total) by mouth daily. 06/12/18  Yes Sowles, Danna Hefty, MD  rosuvastatin (CRESTOR) 40 MG tablet Take 1 tablet (40 mg total) by mouth daily. 06/12/18 07/22/18 Yes Alba Cory, MD  aspirin EC 81 MG tablet Take 81 mg by mouth daily.    [provider]  famotidine (PEPCID) 20 MG tablet Take 1 tablet (20 mg total) by mouth 2 (two) times daily. 06/12/18 07/12/18  Alba Cory, MD  gabapentin (NEURONTIN) 300 MG capsule Take 1 capsule (300 mg total) by mouth 2 (two) times daily. 06/12/18 07/12/18  Alba Cory, MD  insulin glargine (LANTUS) 100 UNIT/ML injection Inject 0.1 mLs (10 Units total) into the skin daily. 06/12/18 07/12/18  Alba Cory, MD  lacosamide 100 MG TABS Take 1 tablet (100 mg total) by mouth 2 (two) times daily. Patient not taking: Reported on 07/22/2018 10/28/17   Enedina Finner, MD  promethazine (PHENERGAN) 12.5 MG tablet Take 1 tablet (12.5 mg  total) by mouth every 6 (six) hours as needed for nausea or vomiting. Patient not taking: Reported on 07/22/2018 08/16/17   Almond Lint, MD  sertraline (ZOLOFT) 25 MG tablet Take 1 tablet (25 mg total) by mouth daily. Patient not taking: Reported on 07/22/2018 06/12/18   Alba Cory, MD   Allergies No Known Allergies  Family History Family History  Problem Relation Age of Onset  . CAD Other    Social History  reports that he has quit smoking. He does  not have any smokeless tobacco history on file. He reports that he drinks alcohol. His drug history is not on file.  Review Of Systems:   Constitutional: Positive for malaise/fatigue but negative fever and chills HENT: Negative for difficulty swallowing and hearing loss.   Eyes: Negative for blurred vision, double vision, pain and redness.  Respiratory: Negative for cough, hemoptysis and shortness of breath.   Cardiovascular: Negative for chest pain, palpitations, orthopnea and leg swelling.  Gastrointestinal: Positive for nausea and vomiting but negative for abdominal pain, constipation and diarrhea.  Genitourinary: Negative for dysuria, frequency and hematuria.  Musculoskeletal: Negative for back pain, joint pain and neck pain.  Skin:  Denies skin lesions and rash Neurological: Negative for dizziness, tremors, focal weakness and weakness.  Endo/Heme/Allergies: Negative for polydipsia. Does not bruise/bleed easily.  Psychiatric/Behavioral: Negative for depression.  Denies anxiety and insomnia  VITAL SIGNS: BP (!) 169/88   Pulse (!) 122   Temp 99.5 F (37.5 C) (Oral)   Resp (!) 26   Ht 6\' 1"  (1.854 m)   Wt (!) 146.5 kg   SpO2 92%   BMI 42.61 kg/m   HEMODYNAMICS:    VENTILATOR SETTINGS:    INTAKE / OUTPUT: No intake/output data recorded.  PHYSICAL EXAMINATION: General: Acutely ill looking,, well-developed and well-nourished, obese HEENT: PERRLA, trachea midline, no JVD Neuro: Alert and oriented x4, cranial nerves intact Cardiovascular: Pickup pulse regular, S1-S2, no murmur regurg or gallop, +2 pulses bilaterally, no edema Lungs: Bilateral breath sounds, no wheezes or rhonchi Abdomen: Obese, normal bowel sounds in all 4 quadrants, palpation reveals no organomegaly Musculoskeletal: Positive range of motion, no deformities Skin: Warm and dry  LABS:  BMET Recent Labs  Lab 09/03/18 1833 09/04/18 0051  NA 135 135  K 2.7* 2.9*  CL 90* 93*  CO2 24 24  BUN <5* <5*   CREATININE 0.59* 0.50*  GLUCOSE 343* 250*    Electrolytes Recent Labs  Lab 09/03/18 1833 09/04/18 0051  CALCIUM 10.0 9.0    CBC Recent Labs  Lab 09/03/18 1833  WBC 7.5  HGB 16.7  HCT 47.6  PLT 152    Coag's No results for input(s): APTT, INR in the last 168 hours.  Sepsis Markers Recent Labs  Lab 09/03/18 1837 09/04/18 0020  LATICACIDVEN 5.51* 3.0*    ABG No results for input(s): PHART, PCO2ART, PO2ART in the last 168 hours.  Liver Enzymes Recent Labs  Lab 09/03/18 1833 09/04/18 0051  AST 229* 178*  ALT 117* 96*  ALKPHOS 83 70  BILITOT 2.6* 3.1*  ALBUMIN 4.1 3.7    Cardiac Enzymes No results for input(s): TROPONINI, PROBNP in the last 168 hours.  Glucose Recent Labs  Lab 09/03/18 1816 09/03/18 2227 09/04/18 0243  GLUCAP 321* 272* 237*    Imaging US Abdomen Limited Ruq  Result Date: 09/03/2018 CLINICAL DATA:  Epigastric pain EXAM: ULTRASOUND ABDOMEN LIMITED RIGHT UPPER QUADRANT COMPARISON:  None. FINDINGS: Gallbladder: No gallstones or wall thickening visualized. No sonographic Murphy sign  noted by sonographer. Common bile duct: Diameter: Normal caliber, 4 mm Liver: Increased echotexture compatible with fatty infiltration. No focal abnormality or biliary ductal dilatation. Portal vein is patent on color Doppler imaging with normal direction of blood flow towards the liver. IMPRESSION: Fatty infiltration of the liver.  No acute findings. Electronically Signed   By: Charlett NoseKevin  Dover M.D.   On: 09/03/2018 22:17   SIGNIFICANT EVENTS: 09/03/2018: Admitted  LINES/TUBES: Peripheral IVs  DISCUSSION: 42 year old male presenting with DKA, EtOH abuse, transaminases, hypokalemia and lactic acidosis due to alcohol intake   ASSESSMENT DKA Hypokalemia Transaminitis Morbid obesity Lactic acidosis  Plan DKA protocol Monitor and correct electrolytes Trend Enzymes Consult to diabetes coordinator Social service consult for medication assistance prior to  discharge Modified diet  Best Practice: Code Status: Full code Diet: Modified diet GI prophylaxis: Not applicable VTE prophylaxis: Subcu Lovenox  FAMILY  - Updates: And and spouse updated at bedside  Eldena Dede [facility] ANP-BC Pulmonary and Critical Care Medicine [facility] Pager [phone] or [phone]  NB: This document was prepared using Dragon voice recognition software and may include unintentional dictation errors.    09/04/2018, 4:01 AM

## 2018-09-04 NOTE — ED Notes (Signed)
Continue to await insulin drip and potassium from pharmacy. Will transport to to CCU.

## 2018-09-04 NOTE — Progress Notes (Addendum)
[employer] - [place] at [facility]   PATIENT NAME: Cory MoralesRyan Snyder    MR#:  [id number]  DATE OF BIRTH:  1976/05/10  SUBJECTIVE:  CHIEF COMPLAINT:   Chief Complaint  Patient presents with  . Hyperglycemia  . Emesis   The patient had anxiety this morning.  He was given Ativan. REVIEW OF SYSTEMS:  Review of Systems  Constitutional: Negative for chills, fever and malaise/fatigue.  HENT: Negative for sore throat.   Eyes: Negative for blurred vision and double vision.  Respiratory: Negative for cough, hemoptysis, shortness of breath, wheezing and stridor.   Cardiovascular: Negative for chest pain, palpitations, orthopnea and leg swelling.  Gastrointestinal: Negative for abdominal pain, blood in stool, diarrhea, melena, nausea and vomiting.  Genitourinary: Negative for dysuria, flank pain and hematuria.  Musculoskeletal: Negative for back pain and joint pain.  Skin: Negative for rash.  Neurological: Negative for dizziness, tremors, sensory change, focal weakness, seizures, loss of consciousness, weakness and headaches.  Endo/Heme/Allergies: Negative for polydipsia.  Psychiatric/Behavioral: Negative for depression. The patient is nervous/anxious.     DRUG ALLERGIES:  No Known Allergies VITALS:  Blood pressure (!) 157/89, pulse 80, temperature 98.6 F (37 C), temperature source Oral, resp. rate (!) 24, height 6\' 1"  (1.854 m), weight (!) 146.5 kg, SpO2 92 %. PHYSICAL EXAMINATION:  Physical Exam  Constitutional: He is oriented to person, place, and time. No distress.  Morbid obesity.  HENT:  Head: Normocephalic.  Mouth/Throat: Oropharynx is clear and moist.  Eyes: Pupils are equal, round, and reactive to light. Conjunctivae and EOM are normal. No scleral icterus.  Neck: Normal range of motion. Neck supple. No JVD present. No tracheal deviation present.  Cardiovascular: Normal rate, regular rhythm and normal heart sounds. Exam reveals no gallop.  No murmur  heard. Pulmonary/Chest: Effort normal and breath sounds normal. No respiratory distress. He has no wheezes. He has no rales.  Abdominal: Soft. Bowel sounds are normal. He exhibits no distension. There is no tenderness. There is no rebound.  Musculoskeletal: Normal range of motion. He exhibits no edema or tenderness.  Neurological: He is alert and oriented to person, place, and time. No cranial nerve deficit.  Skin: No rash noted. No erythema.  Psychiatric: He has a normal mood and affect.   LABORATORY PANEL:  Male CBC Recent Labs  Lab 09/04/18 0343  WBC 6.3  HGB 14.6  HCT 42.8  PLT 103*   ------------------------------------------------------------------------------------------------------------------ Chemistries  Recent Labs  Lab 09/04/18 0051  09/04/18 0739 09/04/18 1434  NA 135   < > 135  --   K 2.9*   < > 2.7* 3.1*  CL 93*   < > 96*  --   CO2 24   < > 28  --   GLUCOSE 250*   < > 217*  --   BUN <5*   < > <5*  --   CREATININE 0.50*   < > 0.53*  --   CALCIUM 9.0   < > 8.6*  --   MG  --   --  1.3*  --   AST 178*  --   --   --   ALT 96*  --   --   --   ALKPHOS 70  --   --   --   BILITOT 3.1*  --   --   --    < > = values in this interval not displayed.   RADIOLOGY:  [place] Abdomen Limited Ruq  Result Date: 09/03/2018 CLINICAL  DATA:  Epigastric pain EXAM: ULTRASOUND ABDOMEN LIMITED RIGHT UPPER QUADRANT COMPARISON:  None. FINDINGS: Gallbladder: No gallstones or wall thickening visualized. No sonographic Murphy sign noted by sonographer. Common bile duct: Diameter: Normal caliber, 4 mm Liver: Increased echotexture compatible with fatty infiltration. No focal abnormality or biliary ductal dilatation. Portal vein is patent on color Doppler imaging with normal direction of blood flow towards the liver. IMPRESSION: Fatty infiltration of the liver.  No acute findings. Electronically Signed   By: Charlett Nose M.D.   On: 09/03/2018 22:17   ASSESSMENT AND PLAN:    DKA (diabetic  ketoacidoses) (HCC) Improved with with IV insulin and IV fluids support.  Diabetes 2.  Continue Lantus and sliding scale.  Transaminitis, possible due to his alcohol use, f/u hepatitis panel.  Nausea & vomiting - Improved, on PRN antiemetics.  Hypokalemia -replace potassium and follow-up level. Hypomagnesemia.  IV magnesium and follow-up level. Hypophosphatemia.  Phosphate supplement and follow-up level.  Hypertension.  Continue Lopressor.  Alcohol abuse -CIWA protocol, Ativan as needed.  Hyperlipidemia.  LDL 220.  Cholesterol 330.  I advised the patient diet control and exercise.  But unable to give statin due to abnormal liver function test this time.  Morbid obesity and metabolic syndrome.  Diet control and exercise advised.  All the records are reviewed and case discussed with Care Management/Social Worker. Management plans discussed with the patient, his mother and they are in agreement.  CODE STATUS: Full Code  TOTAL TIME TAKING CARE OF THIS PATIENT: 33 minutes.   More than 50% of the time was spent in counseling/coordination of care: YES  POSSIBLE D/C IN 2 DAYS, DEPENDING ON CLINICAL CONDITION.   Shaune Pollack M.D on 09/04/2018 at 4:20 PM  Between 7am to 6pm - Pager - [phone]  After 6pm go to [email or web address] - Therapist, nutritional Hospitalists

## 2018-09-04 NOTE — Progress Notes (Addendum)
Inpatient Diabetes Program Recommendations  AACE/ADA: New Consensus Statement on Inpatient Glycemic Control (2015)  Target Ranges:  Prepandial:   less than 140 mg/dL      Peak postprandial:   less than 180 mg/dL (1-2 hours)      Critically ill patients:  140 - 180 mg/dL   Results for Hal MoralesBRINKLEY, Alyn (MRN [id number]) as of 09/04/2018 06:23  Ref. Range 09/03/2018 18:33  Sodium Latest Ref Range: 135 - 145 mmol/L 135  Potassium Latest Ref Range: 3.5 - 5.1 mmol/L 2.7 (LL)  Chloride Latest Ref Range: 98 - 111 mmol/L 90 (L)  CO2 Latest Ref Range: 22 - 32 mmol/L 24  Glucose Latest Ref Range: 70 - 99 mg/dL 409343 (H)  BUN Latest Ref Range: 6 - 20 mg/dL <5 (L)  Creatinine Latest Ref Range: 0.61 - 1.24 mg/dL 8.110.59 (L)  Calcium Latest Ref Range: 8.9 - 10.3 mg/dL 91.410.0  Anion gap Latest Ref Range: 5 - 15  21 (H)    Admit with: Hyperglycemia/ Vomiting/ DKA/ Transaminitis  History: DM (diagnosed 2 months ago), ETOH Abuse  Home DM Meds: Metformin 500 mg BID (Ran out of Metformin about 4 weeks ago)  Current Orders: IV Insulin Drip    Per Dr. Anne HahnWillis' H&P notes: Patient "states he was diagnosed with diabetes about 2 months ago, but has not been able to establish with a PCP yet and so ran out of the initial metformin he was given about 4 weeks ago.  His nausea with vomiting has gotten significant he worse over the past 2 days.  He is also a heavy drinker, drinking about 1/5 of alcohol every day."  No Insurance listed.  Has not established with PCP.  Current Hemoglobin A1c level pending.     MD- Note that 3:43am BMET today shows Anion Gap down to 13 and CO2 27 this AM.  When you deem patient is ready to transition off the IV Insulin drip to SQ Insulin, please consider the following:  1. Start Lantus 20 units Daily (make sure to start Lantus at least 1 hour prior to stopping IV Insulin drip)--This dose would be ~0.15 units/kg dosing based on weight of 146 kg  2. Start Novolog Moderate Correction  Scale/ SSI (0-15 units) TID AC + HS  3. Metformin may not be the best oral DM medication for patient at home given his issues with ETOH abuse (he may be at higher risk for lactic acidosis since he abuses ETOH).  Need to see current Hemoglobin A1c level to help determine best course of action for home DM medications.  Has been without Metformin for 4 weeks now per H&P notes.  If A1c less than 9%, may be able to switch pt to different oral DM medication for home.  If A1c severely elevated, may do better with Insulin at home.      --Will follow patient during hospitalization--  Ambrose FinlandJeannine Johnston Neveah Bang RN, MSN, CDE Diabetes Coordinator Inpatient Glycemic Control Team Team Pager: [phone] (8a-5p)

## 2018-09-04 NOTE — Progress Notes (Signed)
eLink Physician-Brief Progress Note Patient Name: Cory MoralesRyan Vandunk DOB: [birth year] MRN: [id number]   Date of Service  09/04/2018  HPI/Events of Note  42 yo male with PMH of DM and ?diabetic gastroparesis. Presents now with N/V for several days. Found to be in DKA. Now on Insulin IV infusion. PCCM asked to assume care in ICU. VSS.  eICU Interventions  No new orders.      Intervention Category Evaluation Type: New Patient Evaluation  Lenell AntuSommer,Morio Widen Eugene 09/04/2018, 2:57 AM

## 2018-09-04 NOTE — ED Notes (Signed)
hospitalist in to see pt.

## 2018-09-04 NOTE — Care Management Note (Signed)
Case Management Note  Patient Details  Name: Cory MoralesRyan Snyder MRN: [id number] Date of Birth: 1976/07/29  Subjective/Objective:    Patient admitted to the ICU for DKA.  Patient recently diagnosed diabetic.  [facility] prescribed Metformin on 06/30/18 with a note that patient should follow up with PCP in 1 week.  Patient does not currently have a PCP but does have an appointment with Dr. Dossie Arbourrissman on 09/19/18.  Dr. Dossie Arbourrissman is the patient's mother and sister's PCP.  Patient does not have insurance.  RNCM gave the patient an ODC/MM application to fill out and will make a referral.  RNCM also gave the patient a list of $4 [facility].  Patient reports that he able to afford $4 medications.  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  He drives and has reliable transportation.  Patient reports that he is independent in ADL's.    RNCM will cont to follow for any additional discharge needs Robbie LisJeanna Kino Dunsworth RN BSN [phone]               Action/Plan:   Expected Discharge Date:                  Expected Discharge Plan:  Home/Self Care  In-House Referral:     Discharge planning Services  CM Consult  Post Acute Care Choice:    Choice offered to:     DME Arranged:    DME Agency:     HH Arranged:    HH Agency:     Status of Service:  In process, will continue to follow  If discussed at Long Length of Stay Meetings, dates discussed:    Additional Comments:  Allayne ButcherJeanna M Kashlynn Kundert, RN 09/04/2018, 11:25 AM

## 2018-09-04 NOTE — Progress Notes (Signed)
Patient is currently on the DKA protocol with d5 1/2 NS @125  and IV insuline. Last GAP at 0423 was 13. Patient is receiving last run of  Potassium for critical K+ of 2.7. Patient reports "feeling much better" this morning. CIWA is now 0. Currently denied pain and discomfort. Will endorse to oncoming nurse.

## 2018-09-04 NOTE — Progress Notes (Signed)
MEDICATION RELATED CONSULT NOTE   Pharmacy Consult for Electrolyte Management Indication: electrolyte abnormalities after DKA  No Known Allergies  Patient Measurements: Height: 6\' 1"  (185.4 cm) Weight: (!) 322 lb 15.6 oz (146.5 kg) IBW/kg (Calculated) : 79.9 Adjusted Body Weight:   Vital Signs: Temp: 98.6 F (37 C) (11/18 0808) Temp Source: Oral (11/18 0808) BP: 157/89 (11/18 1100) Pulse Rate: 80 (11/18 1100) Intake/Output from previous day: 11/17 0701 - 11/18 0700 In: 2989.7 [I.V.:1213.9; IV Piggyback:1775.8] Out: -  Intake/Output from this shift: Total I/O In: -  Out: 402 [Urine:400; Stool:2]  Labs: Recent Labs    09/03/18 1833 09/04/18 0051 09/04/18 0343 09/04/18 0739  WBC 7.5  --  6.3  --   HGB 16.7  --  14.6  --   HCT 47.6  --  42.8  --   PLT 152  --  103*  --   CREATININE 0.59* 0.50* 0.47* 0.53*  MG  --   --   --  1.3*  PHOS  --   --   --  1.7*  ALBUMIN 4.1 3.7  --   --   PROT 8.6* 7.5  --   --   AST 229* 178*  --   --   ALT 117* 96*  --   --   ALKPHOS 83 70  --   --   BILITOT 2.6* 3.1*  --   --   BILIDIR 1.4*  --   --   --   IBILI 1.2*  --   --   --    Estimated Creatinine Clearance: 183 mL/min (A) (by C-G formula based on SCr of 0.53 mg/dL (L)).   Microbiology: Recent Results (from the past 720 hour(s))  Culture, blood (Routine X 2) w Reflex to ID Panel     Status: None (Preliminary result)   Collection Time: 09/03/18 11:18 PM  Result Value Ref Range Status   Specimen Description BLOOD RIGHT HAND  Final   Special Requests   Final    BOTTLES DRAWN AEROBIC AND ANAEROBIC Blood Culture adequate volume   Culture   Final    NO GROWTH < 12 HOURS Performed at [facility], [address] [place], [place] [address]    Report Status PENDING  Incomplete  Culture, blood (Routine X 2) w Reflex to ID Panel     Status: None (Preliminary result)   Collection Time: 09/03/18 11:18 PM  Result Value Ref Range Status   Specimen Description BLOOD  LEFT HAND  Final   Special Requests   Final    BOTTLES DRAWN AEROBIC AND ANAEROBIC Blood Culture adequate volume   Culture   Final    NO GROWTH < 12 HOURS Performed at [facility], [address] [place], [place] [address]    Report Status PENDING  Incomplete  MRSA PCR Screening     Status: None   Collection Time: 09/04/18  2:45 AM  Result Value Ref Range Status   MRSA by PCR NEGATIVE NEGATIVE Final    Comment:        The GeneXpert MRSA Assay (FDA approved for NASAL specimens only), is one component of a comprehensive MRSA colonization surveillance program. It is not intended to diagnose MRSA infection nor to guide or monitor treatment for MRSA infections. Performed at [facility], [address] [place], [place] [address]     Medical History: Past Medical History:  Diagnosis Date  . Alcohol abuse   . Morbid obesity (HCC)   .  Patient denies medical problems   . Type 2 diabetes mellitus with hyperglycemia [facility])     Assessment: Patient is a 42yo male admitted for DKA. Off Insulin drip at this time. Pharmacy consulted to manage electrolytes.  11/18 AM K=2.7, Mag=1.3, Phos=1.7 Patient has received KCl IV today and PO today.  11/18 14:00 repeat K=3.1  Goal of Therapy:  Maintain electrolytes within range  Plan:  Will order KPhos IV times one and Mag Sulfate 2g IV times one. Will follow up on AM labs.   Clovia Cuff, PharmD, BCPS 09/04/2018 3:24 PM

## 2018-09-04 NOTE — Progress Notes (Signed)
Pt has had multiple BMs after eating. Pt with tremors and sweating, given 1 mg ativan. Now states he feels better and isn't as anxious. [facility], with no new complaints. Will continue to monitor.

## 2018-09-04 NOTE — ED Notes (Signed)
Critical lactic of 3.0 called from lab. Dr. Anne HahnWillis notified via telephone, no new orders verbally received.

## 2018-09-04 NOTE — ED Notes (Signed)
Family and pt updated on delay for admission.

## 2018-09-04 NOTE — ED Notes (Signed)
Pt assisted up to commode to have bowel movement.

## 2018-09-04 NOTE — Telephone Encounter (Signed)
Provided patient with new patient packet to obtain [facility] services.  Patient understands that [facility] must receive 2019 financial documentation in order to obtain medications.  Zakara Parkey J. Destinee Taber Care Manager [facility] 

## 2018-09-04 NOTE — Progress Notes (Signed)
MD-   Metformin may not be the best oral DM medication for patient at home given his issues with ETOH abuse (he may be at higher risk for lactic acidosis since he abuses ETOH).  Need to see current Hemoglobin A1c level to help determine best course of action for home DM medications.  Has been without Metformin for 4 weeks now per H&P notes.  If A1c less than 9%, may be able to switch pt to different oral DM medication for home.  If A1c severely elevated, may do better with Insulin at home.      Met with pt today.  Discussed with patient diagnosis of DKA (pathophysiology), treatment of DKA, lab results, and transition plan to SQ insulin regimen.  Patient told me he was diagnosed with DM back in September.  Took Metformin until he ran out about the middle of October.  Has not seen PCP since that hospitalization ([facility]).  Has appt with Dr. [facility] ([facility]) on 09/19/2018.  Has CBG meter at home but only checking CBGs once daily in the AM.  States CBGs run in the low to mid 200s.  Has been having some symptoms of High CBGs (sweaty and shaky).  Discussed with patient that we are waiting on a lab test called an A1c (explained what this is to patient) to help better determine what and how much medicine pt will need to control CBGs at home.  Discussed with pt that if the A1c level is high, he may need to start insulin at home.  Explained to pt that the RNs will educate pt on how to give insulin if insulin is needed at time of d/c.  Pt was very appreciative of my visit.  Told him we will follow him while he is here and monitor his CBGs closely.  Pt states he is comfortable checking his CBGs and knows he probably needs to check his CBGs more often.       --Will follow patient during hospitalization--  Jeannine Johnston Fishel RN, MSN, CDE Diabetes Coordinator Inpatient Glycemic Control Team Team Pager: [phone] (8a-5p)  

## 2018-09-05 ENCOUNTER — Encounter: Payer: Self-pay | Admitting: Internal Medicine

## 2018-09-05 ENCOUNTER — Inpatient Hospital Stay
Admit: 2018-09-05 | Discharge: 2018-09-05 | Disposition: A | Discharge status: Home / Self Care | Payer: Self-pay | Attending: Internal Medicine | Admitting: Internal Medicine

## 2018-09-05 DIAGNOSIS — I10 Essential (primary) hypertension: Secondary | ICD-10-CM

## 2018-09-05 LAB — BASIC METABOLIC PANEL
Anion gap: 9 (ref 5–15)
BUN: <5 mg/dL — ABNORMAL LOW (ref 6–20)
CALCIUM: 8.8 mg/dL — AB (ref 8.9–10.3)
CO2: 28 mmol/L (ref 22–32)
CREATININE: 0.5 mg/dL — AB (ref 0.61–1.24)
Chloride: 100 mmol/L (ref 98–111)
GFR calc non Af Amer: >60 mL/min (ref >60)
Glucose, Bld: 185 mg/dL — ABNORMAL HIGH (ref 70–99)
Potassium: 3.3 mmol/L — ABNORMAL LOW (ref 3.5–5.1)
SODIUM: 137 mmol/L (ref 135–145)

## 2018-09-05 LAB — POTASSIUM
POTASSIUM: 3.2 mmol/L — AB (ref 3.5–5.1)
POTASSIUM: 3.4 mmol/L — AB (ref 3.5–5.1)
Potassium: 3.1 mmol/L — ABNORMAL LOW (ref 3.5–5.1)
Potassium: 3.5 mmol/L (ref 3.5–5.1)

## 2018-09-05 LAB — GLUCOSE, CAPILLARY
GLUCOSE-CAPILLARY: 144 mg/dL — AB (ref 70–99)
GLUCOSE-CAPILLARY: 139 mg/dL — AB (ref 70–99)
Glucose-Capillary: 167 mg/dL — ABNORMAL HIGH (ref 70–99)
Glucose-Capillary: 185 mg/dL — ABNORMAL HIGH (ref 70–99)

## 2018-09-05 LAB — ECHOCARDIOGRAM COMPLETE
HEIGHTINCHES: 73 in
Weight: 5167.58 oz

## 2018-09-05 LAB — HEPATITIS PANEL, ACUTE
HCV Ab: <0.1 s/co ratio (ref 0.0–0.9)
HEP B C IGM: NEGATIVE
HEP B S AG: NEGATIVE
Hep A IgM: NEGATIVE

## 2018-09-05 LAB — HIV ANTIBODY (ROUTINE TESTING W REFLEX): HIV Screen 4th Generation wRfx: NONREACTIVE

## 2018-09-05 LAB — PHOSPHORUS
PHOSPHORUS: 1.7 mg/dL — AB (ref 2.5–4.6)
Phosphorus: 1.9 mg/dL — ABNORMAL LOW (ref 2.5–4.6)

## 2018-09-05 LAB — MAGNESIUM: Magnesium: 1.7 mg/dL (ref 1.7–2.4)

## 2018-09-05 LAB — TROPONIN I

## 2018-09-05 MED ORDER — POTASSIUM CHLORIDE IN NACL 40-0.9 MEQ/L-% IV SOLN
INTRAVENOUS | Status: DC
  Administered 2018-09-05 (×2): 75 mL/h via INTRAVENOUS
  Filled 2018-09-05 (×2): qty 1000

## 2018-09-05 MED ORDER — POTASSIUM CHLORIDE 20 MEQ PO PACK
40.0000 meq | PACK | Freq: Once | ORAL | Status: AC
  Administered 2018-09-05: 40 meq via ORAL
  Filled 2018-09-05: qty 2

## 2018-09-05 MED ORDER — POTASSIUM PHOSPHATES 15 MMOLE/5ML IV SOLN
30.0000 mmol | Freq: Once | INTRAVENOUS | Status: AC
  Administered 2018-09-05: 30 mmol via INTRAVENOUS
  Filled 2018-09-05: qty 10

## 2018-09-05 MED ORDER — POTASSIUM CHLORIDE CRYS ER 20 MEQ PO TBCR
40.0000 meq | EXTENDED_RELEASE_TABLET | ORAL | Status: AC
  Administered 2018-09-05 (×2): 40 meq via ORAL
  Filled 2018-09-05 (×2): qty 2

## 2018-09-05 MED ORDER — MAGNESIUM SULFATE 2 GM/50ML IV SOLN
2.0000 g | Freq: Once | INTRAVENOUS | Status: AC
  Administered 2018-09-05: 16:00:00 2 g via INTRAVENOUS
  Filled 2018-09-05: qty 50

## 2018-09-05 NOTE — Progress Notes (Signed)
*  PRELIMINARY RESULTS* Echocardiogram 2D Echocardiogram has been performed.  Cristela BlueHege, Zauria Dombek 09/05/2018, 8:23 AM

## 2018-09-05 NOTE — Plan of Care (Signed)
Nutrition Education Note   RD consulted for nutrition education regarding diabetes.   42 y/o male with h/o etoh abuse, morbid obesity, DM admitted with DKA.   Lab Results  Component Value Date   HGBA1C 10.6 (H) 09/04/2018    RD provided "Nutrition and Type II Diabetes" handout from the Academy of Nutrition and Dietetics. Discussed different food groups and their effects on blood sugar, emphasizing carbohydrate-containing foods. Provided list of carbohydrates and recommended serving sizes of common foods.  Discussed importance of controlled and consistent carbohydrate intake throughout the day. Provided examples of ways to balance meals/snacks and encouraged intake of high-fiber, whole grain complex carbohydrates. Teach back method used.  Expect fair compliance.  Body mass index is 42.61 kg/m. Pt meets criteria for morbid obesity based on current BMI.  Current diet order is CHO controlled, patient is consuming approximately 100% of meals at this time. Labs and medications reviewed. No further nutrition interventions warranted at this time. RD contact information provided. If additional nutrition issues arise, please re-consult RD.  Cory Holidayasey Vanden Fawaz MS, RD, LDN Pager #- [phone] Office#- [phone] After Hours Pager: [phone]

## 2018-09-05 NOTE — Progress Notes (Signed)
MEDICATION RELATED CONSULT NOTE   Pharmacy Consult for Electrolyte Management Indication: electrolyte abnormalities after DKA  No Known Allergies  Patient Measurements: Height: 6\' 1"  (185.4 cm) Weight: (!) 322 lb 15.6 oz (146.5 kg) IBW/kg (Calculated) : 79.9 Adjusted Body Weight:   Vital Signs: Temp: 98.7 F (37.1 C) (11/18 2000) Temp Source: Oral (11/18 2000) BP: 155/82 (11/19 0100) Pulse Rate: 82 (11/19 0100) Intake/Output from previous day: 11/18 0701 - 11/19 0700 In: 2862.1 [I.V.:2862.1] Out: 402 [Urine:400; Stool:2] Intake/Output from this shift: Total I/O In: 1300.9 [I.V.:1300.9] Out: -   Labs: Recent Labs    09/03/18 1833 09/04/18 0051 09/04/18 0343 09/04/18 0739  WBC 7.5  --  6.3  --   HGB 16.7  --  14.6  --   HCT 47.6  --  42.8  --   PLT 152  --  103*  --   CREATININE 0.59* 0.50* 0.47* 0.53*  MG  --   --   --  1.3*  PHOS  --   --   --  1.7*  ALBUMIN 4.1 3.7  --   --   PROT 8.6* 7.5  --   --   AST 229* 178*  --   --   ALT 117* 96*  --   --   ALKPHOS 83 70  --   --   BILITOT 2.6* 3.1*  --   --   BILIDIR 1.4*  --   --   --   IBILI 1.2*  --   --   --    Estimated Creatinine Clearance: 183 mL/min (A) (by C-G formula based on SCr of 0.53 mg/dL (L)).   Microbiology: Recent Results (from the past 720 hour(s))  Culture, blood (Routine X 2) w Reflex to ID Panel     Status: None (Preliminary result)   Collection Time: 09/03/18 11:18 PM  Result Value Ref Range Status   Specimen Description BLOOD RIGHT HAND  Final   Special Requests   Final    BOTTLES DRAWN AEROBIC AND ANAEROBIC Blood Culture adequate volume   Culture   Final    NO GROWTH < 12 HOURS Performed at [facility], [address] [place], [place] [address]    Report Status PENDING  Incomplete  Culture, blood (Routine X 2) w Reflex to ID Panel     Status: None (Preliminary result)   Collection Time: 09/03/18 11:18 PM  Result Value Ref Range Status   Specimen Description BLOOD LEFT  HAND  Final   Special Requests   Final    BOTTLES DRAWN AEROBIC AND ANAEROBIC Blood Culture adequate volume   Culture   Final    NO GROWTH < 12 HOURS Performed at [facility], [address] [place], [place] [address]    Report Status PENDING  Incomplete  MRSA PCR Screening     Status: None   Collection Time: 09/04/18  2:45 AM  Result Value Ref Range Status   MRSA by PCR NEGATIVE NEGATIVE Final    Comment:        The GeneXpert MRSA Assay (FDA approved for NASAL specimens only), is one component of a comprehensive MRSA colonization surveillance program. It is not intended to diagnose MRSA infection nor to guide or monitor treatment for MRSA infections. Performed at [facility], [address] [place], [place] [address]     Medical History: Past Medical History:  Diagnosis Date  . Alcohol abuse   . Morbid obesity (HCC)   . Patient  denies medical problems   . Type 2 diabetes mellitus with hyperglycemia [facility])     Assessment: Patient is a 42yo male admitted for DKA. Off Insulin drip at this time. Pharmacy consulted to manage electrolytes.  11/18 AM K=2.7, Mag=1.3, Phos=1.7 Patient has received KCl IV today and PO today.  11/18 14:00 repeat K=3.1  Goal of Therapy:  Maintain electrolytes within range  Plan:  11/19 0000 K 3.4 will replace w/ KCI 40 mEq PO x 2 and will recheck w/ am labs.  Thomasene Ripple, PharmD, BCPS Clinical Pharmacist 09/05/2018

## 2018-09-05 NOTE — Progress Notes (Signed)
Inpatient Diabetes Program Recommendations  AACE/ADA: New Consensus Statement on Inpatient Glycemic Control (2015)  Target Ranges:  Prepandial:   less than 140 mg/dL      Peak postprandial:   less than 180 mg/dL (1-2 hours)      Critically ill patients:  140 - 180 mg/dL   Lab Results  Component Value Date   GLUCAP 167 (H) 09/05/2018   HGBA1C 10.6 (H) 09/04/2018    Review of Glycemic ControlResults for Hal MoralesBRINKLEY, Cory Snyder (MRN [id number]) as of 09/05/2018 11:00  Ref. Range 09/04/2018 08:50 09/04/2018 11:43 09/04/2018 16:22 09/04/2018 21:31 09/05/2018 07:26  Glucose-Capillary Latest Ref Range: 70 - 99 mg/dL 409186 (H) 811170 (H) 914271 (H) 255 (H) 167 (H)   History: DM (diagnosed 2 months ago), ETOH Abuse  Home DM Meds: Metformin 500 mg BID (Ran out of Metformin about 4 weeks ago)  Current orders for Inpatient glycemic control:  Lantus 10 units bid, Novolog sensitive tid with meals Inpatient Diabetes Program Recommendations:   Note A1C=10.6% indicating that patient may need insulin at home.  Diabetes Coordinator spoke with patient on 09/04/18.  Will ask RN to begin teaching patient proper insulin administration.    Thanks,  Beryl MeagerJenny Ndea Kilroy, RN, BC-ADM Inpatient Diabetes Coordinator Pager [phone] (8a-5p)

## 2018-09-05 NOTE — Progress Notes (Addendum)
[facility] [place] at [facility]   PATIENT NAME: Cory Snyder    MR#:  [id number]  DATE OF BIRTH:  May 06, 1976  SUBJECTIVE:  CHIEF COMPLAINT:   Chief Complaint  Patient presents with  . Hyperglycemia  . Emesis   The patient has no complaints. REVIEW OF SYSTEMS:  Review of Systems  Constitutional: Negative for chills, fever and malaise/fatigue.  HENT: Negative for sore throat.   Eyes: Negative for blurred vision and double vision.  Respiratory: Negative for cough, hemoptysis, shortness of breath, wheezing and stridor.   Cardiovascular: Negative for chest pain, palpitations, orthopnea and leg swelling.  Gastrointestinal: Negative for abdominal pain, blood in stool, diarrhea, melena, nausea and vomiting.  Genitourinary: Negative for dysuria, flank pain and hematuria.  Musculoskeletal: Negative for back pain and joint pain.  Skin: Negative for rash.  Neurological: Negative for dizziness, tremors, sensory change, focal weakness, seizures, loss of consciousness, weakness and headaches.  Endo/Heme/Allergies: Negative for polydipsia.  Psychiatric/Behavioral: Negative for depression. The patient is not nervous/anxious.     DRUG ALLERGIES:  No Known Allergies VITALS:  Blood pressure 135/82, pulse 86, temperature 98.6 F (37 C), temperature source Oral, resp. rate (!) 23, height 6\' 1"  (1.854 m), weight (!) 146.5 kg, SpO2 97 %. PHYSICAL EXAMINATION:  Physical Exam  Constitutional: He is oriented to person, place, and time. No distress.  Morbid obesity.  HENT:  Head: Normocephalic.  Mouth/Throat: Oropharynx is clear and moist.  Eyes: Pupils are equal, round, and reactive to light. Conjunctivae and EOM are normal. No scleral icterus.  Neck: Normal range of motion. Neck supple. No JVD present. No tracheal deviation present.  Cardiovascular: Normal rate, regular rhythm and normal heart sounds. Exam reveals no gallop.  No murmur heard. Pulmonary/Chest: Effort  normal and breath sounds normal. No respiratory distress. He has no wheezes. He has no rales.  Abdominal: Soft. Bowel sounds are normal. He exhibits no distension. There is no tenderness. There is no rebound.  Musculoskeletal: Normal range of motion. He exhibits no edema or tenderness.  Neurological: He is alert and oriented to person, place, and time. No cranial nerve deficit.  Skin: No rash noted. No erythema.  Psychiatric: He has a normal mood and affect.   LABORATORY PANEL:  Male CBC Recent Labs  Lab 09/04/18 0343  WBC 6.3  HGB 14.6  HCT 42.8  PLT 103*   ------------------------------------------------------------------------------------------------------------------ Chemistries  Recent Labs  Lab 09/04/18 0051  09/05/18 0554  NA 135   < > 137  K 2.9*   < > 3.3*  CL 93*   < > 100  CO2 24   < > 28  GLUCOSE 250*   < > 185*  BUN <5*   < > <5*  CREATININE 0.50*   < > 0.50*  CALCIUM 9.0   < > 8.8*  MG  --    < > 1.7  AST 178*  --   --   ALT 96*  --   --   ALKPHOS 70  --   --   BILITOT 3.1*  --   --    < > = values in this interval not displayed.   RADIOLOGY:  No results found. ASSESSMENT AND PLAN:    DKA (diabetic ketoacidoses) (HCC) Improved with with IV insulin and IV fluids support.  Diabetes 2.  Continue Lantus and sliding scale.  Transaminitis, possible due to fatty liver and alcohol use, negative hepatitis panel.  Follow-up PCP.  Nausea & vomiting - Improved,  on PRN antiemetics.  Abnormal electrolyte. Hypokalemia -replace potassium and follow-up level. Hypomagnesemia.  IV magnesium and follow-up level. Hypophosphatemia.  Phosphate supplement and follow-up level.  Hypertension.  Continue Lopressor.  Alcohol abuse -CIWA protocol, Ativan as needed.  Hyperlipidemia.  LDL 220.  Cholesterol 330.  I advised the patient diet control and exercise.  But unable to give statin due to abnormal liver function test this time.  Morbid obesity and metabolic  syndrome.  Diet control and exercise advised.  CAD, suspected CAD per echocardiogram.  Ejection fraction is normal.  The patient has high risk of CAD due to current condition and family history of heart attack (his mother).  Continue aspirin. Stress test tomorrow and cardiology consult.  All the records are reviewed and case discussed with Care Management/Social Worker. Management plans discussed with the patient, his girlfriend and they are in agreement.  CODE STATUS: Full Code  TOTAL TIME TAKING CARE OF THIS PATIENT: 26 minutes.   More than 50% of the time was spent in counseling/coordination of care: YES  POSSIBLE D/C IN 1-2 DAYS, DEPENDING ON CLINICAL CONDITION.   Cory PollackQing Jamere Snyder M.D on 09/05/2018 at 2:24 PM  Between 7am to 6pm - Pager - [phone]  After 6pm go to [email or web address] - Therapist, nutritionalpassword [email or web address]  [facility] [place] [facility]

## 2018-09-05 NOTE — Progress Notes (Addendum)
MEDICATION RELATED CONSULT NOTE   Pharmacy Consult for Electrolyte Management Indication: electrolyte abnormalities after DKA  Intake/Output from previous day: 11/18 0701 - 11/19 0700 In: 3993 [I.V.:3589.5; IV Piggyback:403.5] Out: 402 [Urine:400; Stool:2] Intake/Output from this shift: No intake/output data recorded.  Labs: Recent Labs    09/03/18 1833 09/04/18 0051 09/04/18 0343 09/04/18 0739 09/05/18 0232 09/05/18 0554  WBC 7.5  --  6.3  --   --   --   HGB 16.7  --  14.6  --   --   --   HCT 47.6  --  42.8  --   --   --   PLT 152  --  103*  --   --   --   CREATININE 0.59* 0.50* 0.47* 0.53*  --  0.50*  MG  --   --   --  1.3*  --  1.7  PHOS  --   --   --  1.7* 1.9* 1.7*  ALBUMIN 4.1 3.7  --   --   --   --   PROT 8.6* 7.5  --   --   --   --   AST 229* 178*  --   --   --   --   ALT 117* 96*  --   --   --   --   ALKPHOS 83 70  --   --   --   --   BILITOT 2.6* 3.1*  --   --   --   --   BILIDIR 1.4*  --   --   --   --   --   IBILI 1.2*  --   --   --   --   --     Potassium (mmol/L)  Date Value  09/05/2018 3.3 (L)   Magnesium (mg/dL)  Date Value  62/13/086511/19/2019 1.7   Phosphorus (mg/dL)  Date Value  78/46/962911/19/2019 1.7 (L)   Calcium (mg/dL)  Date Value  52/84/132411/19/2019 8.8 (L)   Albumin (g/dL)  Date Value  40/10/272511/18/2019 3.7  ] Estimated Creatinine Clearance: 183 mL/min (A) (by C-G formula based on SCr of 0.5 mg/dL (L)).   Medical History: Past Medical History:  Diagnosis Date  . Alcohol abuse   . Morbid obesity (HCC)   . Patient denies medical problems   . Type 2 diabetes mellitus with hyperglycemia [facility](HCC)     Assessment: Patient is a 42yo male admitted for DKA. Patient also has hx of alcohol abuse. Off Insulin drip at this time. Pharmacy consulted to manage electrolytes.  11/19 @ 0600 K: 3.3, Phos: 1.7, Mg: 1.7  Goal of Therapy:  Maintain electrolytes within range  Plan:  11/19 AM: Patient is currently receiving NaCl w/40 mEq KCl @ 7075mL/hr.   Will replace with  KPhos IV 30mmol IV x 1 dose, KCL 40mEq PO x 1 dose and Mag Sulfate 2g IV x 1 dose.   Recheck all electrolytes with AM labs and pharmacy will continue to replace as needed.   Gardner CandleSheema M Stella Encarnacion, PharmD, BCPS Clinical Pharmacist 09/05/2018 7:27 AM

## 2018-09-06 ENCOUNTER — Other Ambulatory Visit: Payer: Self-pay

## 2018-09-06 LAB — GLUCOSE, CAPILLARY
GLUCOSE-CAPILLARY: 141 mg/dL — AB (ref 70–99)
GLUCOSE-CAPILLARY: 121 mg/dL — AB (ref 70–99)

## 2018-09-06 LAB — BASIC METABOLIC PANEL
ANION GAP: 11 (ref 5–15)
BUN: 6 mg/dL (ref 6–20)
CHLORIDE: 101 mmol/L (ref 98–111)
CO2: 26 mmol/L (ref 22–32)
Calcium: 9.3 mg/dL (ref 8.9–10.3)
Creatinine, Ser: 0.47 mg/dL — ABNORMAL LOW (ref 0.61–1.24)
GFR calc Af Amer: >60 mL/min (ref >60)
GFR calc non Af Amer: >60 mL/min (ref >60)
Glucose, Bld: 153 mg/dL — ABNORMAL HIGH (ref 70–99)
POTASSIUM: 3 mmol/L — AB (ref 3.5–5.1)
Sodium: 138 mmol/L (ref 135–145)

## 2018-09-06 LAB — PHOSPHORUS: Phosphorus: 3.2 mg/dL (ref 2.5–4.6)

## 2018-09-06 LAB — MAGNESIUM: MAGNESIUM: 1.8 mg/dL (ref 1.7–2.4)

## 2018-09-06 LAB — POTASSIUM
POTASSIUM: 3 mmol/L — AB (ref 3.5–5.1)
Potassium: 3.5 mmol/L (ref 3.5–5.1)

## 2018-09-06 MED ORDER — ADULT MULTIVITAMIN W/MINERALS CH: 1.0000 | ORAL_TABLET | Freq: Every day | ORAL | 2 refills | Status: DC

## 2018-09-06 MED ORDER — POTASSIUM CHLORIDE CRYS ER 20 MEQ PO TBCR
40.0000 meq | EXTENDED_RELEASE_TABLET | ORAL | Status: DC
  Administered 2018-09-06: 40 meq via ORAL
  Filled 2018-09-06: qty 2

## 2018-09-06 MED ORDER — METOPROLOL TARTRATE 25 MG PO TABS: 12.5000 mg | ORAL_TABLET | Freq: Two times a day (BID) | ORAL | 1 refills | Status: DC

## 2018-09-06 MED ORDER — POTASSIUM CHLORIDE CRYS ER 20 MEQ PO TBCR
40.0000 meq | EXTENDED_RELEASE_TABLET | Freq: Once | ORAL | Status: AC
  Administered 2018-09-06: 40 meq via ORAL
  Filled 2018-09-06: qty 2

## 2018-09-06 MED ORDER — ASPIRIN 81 MG PO TBEC: 81.0000 mg | DELAYED_RELEASE_TABLET | Freq: Every day | ORAL | 1 refills | Status: DC

## 2018-09-06 MED ORDER — METFORMIN HCL 500 MG PO TABS: 500.0000 mg | ORAL_TABLET | Freq: Two times a day (BID) | ORAL | 2 refills | Status: DC

## 2018-09-06 MED ORDER — METFORMIN HCL 500 MG PO TABS
500.0000 mg | ORAL_TABLET | Freq: Two times a day (BID) | ORAL | Status: DC
  Filled 2018-09-06: qty 1

## 2018-09-06 NOTE — Progress Notes (Signed)
MEDICATION RELATED CONSULT NOTE   Pharmacy Consult for Electrolyte Management Indication: electrolyte abnormalities after DKA  Intake/Output from previous day: 11/19 0701 - 11/20 0700 In: 1814.1 [P.O.:600; I.V.:652.3; IV Piggyback:561.8] Out: 2 [Urine:1; Stool:1] Intake/Output from this shift: No intake/output data recorded.  Labs: Recent Labs    09/03/18 1833 09/04/18 0051 09/04/18 0343  09/04/18 0739 09/05/18 0232 09/05/18 0554 09/06/18 0506  WBC 7.5  --  6.3  --   --   --   --   --   HGB 16.7  --  14.6  --   --   --   --   --   HCT 47.6  --  42.8  --   --   --   --   --   PLT 152  --  103*  --   --   --   --   --   CREATININE 0.59* 0.50* 0.47*  --  0.53*  --  0.50* 0.47*  MG  --   --   --   --  1.3*  --  1.7 1.8  PHOS  --   --   --    < > 1.7* 1.9* 1.7* 3.2  ALBUMIN 4.1 3.7  --   --   --   --   --   --   PROT 8.6* 7.5  --   --   --   --   --   --   AST 229* 178*  --   --   --   --   --   --   ALT 117* 96*  --   --   --   --   --   --   ALKPHOS 83 70  --   --   --   --   --   --   BILITOT 2.6* 3.1*  --   --   --   --   --   --   BILIDIR 1.4*  --   --   --   --   --   --   --   IBILI 1.2*  --   --   --   --   --   --   --    < > = values in this interval not displayed.    Potassium (mmol/L)  Date Value  09/06/2018 3.0 (L)  09/06/2018 3.0 (L)   Magnesium (mg/dL)  Date Value  45/40/981111/20/2019 1.8   Phosphorus (mg/dL)  Date Value  91/47/829511/20/2019 3.2   Calcium (mg/dL)  Date Value  62/13/086511/20/2019 9.3   Albumin (g/dL)  Date Value  78/46/962911/18/2019 3.7  ] Estimated Creatinine Clearance: 183 mL/min (A) (by C-G formula based on SCr of 0.47 mg/dL (L)).   Medical History: Past Medical History:  Diagnosis Date  . Alcohol abuse   . Morbid obesity (HCC)   . Patient denies medical problems   . Type 2 diabetes mellitus with hyperglycemia [facility](HCC)     Assessment: Patient is a 42yo male admitted for DKA. Patient also has hx of alcohol abuse. Off Insulin drip at this time. Pharmacy  consulted to manage electrolytes.  11/19 @ 0600 K: 3.3, Phos: 1.7, Mg: 1.7  Goal of Therapy:  Maintain electrolytes within range  Plan:  11/20 AM: Patient is no longer receiving NaCl w/40 mEq KCl @ 6175mL/hr.   K 3, Ca 9.3, Mg/Phos not assessed. Will give Klor-Con 40 mEq po Q4H x 2 doses.  Recheck all electrolytes with  AM labs and pharmacy will continue to replace as needed.   Carola Frost, PharmD, BCPS Clinical Pharmacist 09/06/2018 7:17 AM

## 2018-09-06 NOTE — Care Management (Signed)
Discharge to home today per Dr. Enedina FinnerSona Patel. Follow-up appointment with Dr, Dossie Arbourrissman already scheduled for 09/19/18.  Prescriptions faxed to Medication Management Gwenette GreetBrenda S Kadden Osterhout RN MSN CCM Care Management [phone]

## 2018-09-06 NOTE — Discharge Summary (Signed)
[facility] - [place] at [facility]   PATIENT NAME: Cory Snyder    MR#:  [id number]  DATE OF BIRTH:  20-Feb-1976  DATE OF ADMISSION:  09/03/2018 ADMITTING PHYSICIAN: Oralia Manis, MD  DATE OF DISCHARGE: 09/06/2018  PRIMARY CARE PHYSICIAN: Patient, No Pcp Per    ADMISSION DIAGNOSIS:  Epigastric pain [R10.13] Elevated liver enzymes [R74.8] DKA (diabetic ketoacidoses) (HCC) [E11.10]  DISCHARGE DIAGNOSIS:  DKA in type II diabetes--resolved Type II Dm new onset Hyperlipidemia--new HTN Obesity  SECONDARY DIAGNOSIS:   Past Medical History:  Diagnosis Date  . Alcohol abuse   . Morbid obesity (HCC)   . Patient denies medical problems   . Type 2 diabetes mellitus with hyperglycemia [facility])     HOSPITAL COURSE:  Cory Snyder  is a 42 y.o. male who presents with nausea with vomiting for the past 2 weeks.  He states he was diagnosed with diabetes about 2 months ago, but has not been able to establish with a PCP yet and so ran out of the initial metformin he was given about 4 weeks ago.   *DKA (diabetic ketoacidoses) (HCC) Improved with with IV insulin and IV fluids support--resolved  *Diabetes 2.   -Hba1c is 10.3%--cont metformin -patient will continue sugar checks at home. Follow-up with primary care physician. If sugars remain out of control than start insulin. Sugars are in the 150-200  *Transaminitis, possible due to fatty liver and alcohol use, negative hepatitis panel.  Follow-up PCP. -LFTs trending down. -Primary care physician to check LFTs on follow-up. If normalized then start patient on statins for hyperlipidemia.  *Nausea &vomiting -resolved Improved, onPRN antiemetics.  *Abnormal electrolyte. Hypokalemia -replace potassium and follow-up level.---pt received 40 meq at 8 am and another dose around 10 am- will check levels prior to d/c Hypomagnesemia and Hypophosphatemia.  resolved  *Hypertension.  Continue Lopressor.  *Alcohol  abuse -CIWA protocol, Ativan as needed. Scoring low. Vitals stable  *Hyperlipidemia.  LDL 220.  Cholesterol 330.  I advised the patient diet control and exercise.  But unable to give statin due to abnormal liver function test this time.  *Morbid obesity and metabolic syndrome.  Diet control and exercise advised.  *CAD, suspected CAD per echocardiogram.  Ejection fraction is normal.  The patient has high risk of CAD due to current condition and family history of heart attack (his mother).  Continue aspirin. Stress test ---as outpt per Dr Welton Flakes  Patient overall is doing well. Will discharge after potassium is replaced. Discharge instructions given to patient and family member. Patient will get medications for medication management clinic. CONSULTS OBTAINED:    DRUG ALLERGIES:  No Known Allergies  DISCHARGE MEDICATIONS:   Allergies as of 09/06/2018   No Known Allergies     Medication List    TAKE these medications   aspirin 81 MG EC tablet Take 1 tablet (81 mg total) by mouth daily.   metFORMIN 500 MG tablet Commonly known as:  GLUCOPHAGE Take 1 tablet (500 mg total) by mouth 2 (two) times daily with a meal.   metoprolol tartrate 25 MG tablet Commonly known as:  LOPRESSOR Take 0.5 tablets (12.5 mg total) by mouth 2 (two) times daily.   multivitamin with minerals Tabs tablet Take 1 tablet by mouth daily.       If you experience worsening of your admission symptoms, develop shortness of breath, life threatening emergency, suicidal or homicidal thoughts you must seek medical attention immediately by calling 911 or calling your MD immediately  if  symptoms less severe.  You Must read complete instructions/literature along with all the possible adverse reactions/side effects for all the Medicines you take and that have been prescribed to you. Take any new Medicines after you have completely understood and accept all the possible adverse reactions/side effects.   Please  note  You were cared for by a hospitalist during your hospital stay. If you have any questions about your discharge medications or the care you received while you were in the hospital after you are discharged, you can call the unit and asked to speak with the hospitalist on call if the hospitalist that took care of you is not available. Once you are discharged, your primary care physician will handle any further medical issues. Please note that NO REFILLS for any discharge medications will be authorized once you are discharged, as it is imperative that you return to your primary care physician (or establish a relationship with a primary care physician if you do not have one) for your aftercare needs so that they can reassess your need for medications and monitor your lab values. Today   SUBJECTIVE   Doing well. Wishes to go home today. Some diarrhea.  VITAL SIGNS:  Blood pressure (!) 169/96, pulse 99, temperature 98.7 F (37.1 C), temperature source Oral, resp. rate 20, height 6\' 1"  (1.854 m), weight (!) 146.5 kg, SpO2 97 %.  I/O:    Intake/Output Summary (Last 24 hours) at 09/06/2018 0921 Last data filed at 09/05/2018 1831 Gross per 24 hour  Intake 1814.1 ml  Output 2 ml  Net 1812.1 ml    PHYSICAL EXAMINATION:  GENERAL:  42 y.o.-year-old patient lying in the bed with no acute distress. obesity EYES: Pupils equal, round, reactive to light and accommodation. No scleral icterus. Extraocular muscles intact.  HEENT: Head atraumatic, normocephalic. Oropharynx and nasopharynx clear.  NECK:  Supple, no jugular venous distention. No thyroid enlargement, no tenderness.  LUNGS: Normal breath sounds bilaterally, no wheezing, rales,rhonchi or crepitation. No use of accessory muscles of respiration.  CARDIOVASCULAR: S1, S2 normal. No murmurs, rubs, or gallops.  ABDOMEN: Soft, non-tender, non-distended. Bowel sounds present. No organomegaly or mass.  EXTREMITIES: No pedal edema, cyanosis, or  clubbing.  NEUROLOGIC: Cranial nerves II through XII are intact. Muscle strength 5/5 in all extremities. Sensation intact. Gait not checked.  PSYCHIATRIC: The patient is alert and oriented x 3.  SKIN: No obvious rash, lesion, or ulcer.   DATA REVIEW:   CBC  Recent Labs  Lab 09/04/18 0343  WBC 6.3  HGB 14.6  HCT 42.8  PLT 103*    Chemistries  Recent Labs  Lab 09/04/18 0051  09/06/18 0506  NA 135   < > 138  K 2.9*   < > 3.0*  3.0*  CL 93*   < > 101  CO2 24   < > 26  GLUCOSE 250*   < > 153*  BUN <5*   < > 6  CREATININE 0.50*   < > 0.47*  CALCIUM 9.0   < > 9.3  MG  --    < > 1.8  AST 178*  --   --   ALT 96*  --   --   ALKPHOS 70  --   --   BILITOT 3.1*  --   --    < > = values in this interval not displayed.    Microbiology Results   Recent Results (from the past 240 hour(s))  Culture, blood (Routine X 2) w Reflex  to ID Panel     Status: None (Preliminary result)   Collection Time: 09/03/18 11:18 PM  Result Value Ref Range Status   Specimen Description BLOOD RIGHT HAND  Final   Special Requests   Final    BOTTLES DRAWN AEROBIC AND ANAEROBIC Blood Culture adequate volume   Culture   Final    NO GROWTH 3 DAYS Performed at [facility], [address] [place], [place] [address]    Report Status PENDING  Incomplete  Culture, blood (Routine X 2) w Reflex to ID Panel     Status: None (Preliminary result)   Collection Time: 09/03/18 11:18 PM  Result Value Ref Range Status   Specimen Description BLOOD LEFT HAND  Final   Special Requests   Final    BOTTLES DRAWN AEROBIC AND ANAEROBIC Blood Culture adequate volume   Culture   Final    NO GROWTH 3 DAYS Performed at [facility], [address] [place], [place] [address]    Report Status PENDING  Incomplete  MRSA PCR Screening     Status: None   Collection Time: 09/04/18  2:45 AM  Result Value Ref Range Status   MRSA by PCR NEGATIVE NEGATIVE Final    Comment:        The GeneXpert MRSA Assay  (FDA approved for NASAL specimens only), is one component of a comprehensive MRSA colonization surveillance program. It is not intended to diagnose MRSA infection nor to guide or monitor treatment for MRSA infections. Performed at [facility], [address] [place], [place] [address]     RADIOLOGY:  No results found.   Management plans discussed with the patient, family and they are in agreement.  CODE STATUS:     Code Status Orders  (From admission, onward)         Start     Ordered   09/04/18 0243  Full code  Continuous     09/04/18 0242        Code Status History    This patient has a current code status but no historical code status.      TOTAL TIME TAKING CARE OF THIS PATIENT: *40* minutes.    Enedina FinnerSona Saskia Simerson M.D on 09/06/2018 at 9:21 AM  Between 7am to 6pm - Pager - [phone] After 6pm go to [email or web address] - password [email or web address]  [facility]  Office  [phone]  CC: Primary care physician; Patient, No Pcp Per

## 2018-09-06 NOTE — Progress Notes (Signed)
Patient discharged home per MD orders. Prescriptions given to patient. All discharge instructions given and all questions answered. 

## 2018-09-06 NOTE — Discharge Instructions (Signed)
Keep log of your sugars at home for primary care physician to review. Diet and exercise according to our discussion

## 2018-09-08 LAB — CULTURE, BLOOD (ROUTINE X 2)
CULTURE: NO GROWTH
CULTURE: NO GROWTH
SPECIAL REQUESTS: ADEQUATE
Special Requests: ADEQUATE

## 2018-09-19 ENCOUNTER — Encounter: Payer: Self-pay | Admitting: Family Medicine

## 2018-09-19 ENCOUNTER — Telehealth: Payer: Self-pay | Admitting: Adult Health Nurse Practitioner

## 2018-09-19 ENCOUNTER — Ambulatory Visit: Payer: Self-pay | Admitting: Family Medicine

## 2018-09-19 VITALS — BP 137/89 | HR 90 | Temp 98.4°F | Ht 72.0 in | Wt 319.0 lb

## 2018-09-19 DIAGNOSIS — I10 Essential (primary) hypertension: Secondary | ICD-10-CM | POA: Insufficient documentation

## 2018-09-19 DIAGNOSIS — R740 Nonspecific elevation of levels of transaminase and lactic acid dehydrogenase [LDH]: Secondary | ICD-10-CM

## 2018-09-19 DIAGNOSIS — R7401 Elevation of levels of liver transaminase levels: Secondary | ICD-10-CM

## 2018-09-19 DIAGNOSIS — E118 Type 2 diabetes mellitus with unspecified complications: Secondary | ICD-10-CM

## 2018-09-19 DIAGNOSIS — E785 Hyperlipidemia, unspecified: Secondary | ICD-10-CM | POA: Insufficient documentation

## 2018-09-19 DIAGNOSIS — E1169 Type 2 diabetes mellitus with other specified complication: Secondary | ICD-10-CM

## 2018-09-19 DIAGNOSIS — E876 Hypokalemia: Secondary | ICD-10-CM

## 2018-09-19 DIAGNOSIS — E1165 Type 2 diabetes mellitus with hyperglycemia: Secondary | ICD-10-CM

## 2018-09-19 HISTORY — DX: Type 2 diabetes mellitus with unspecified complications: E11.8

## 2018-09-19 LAB — MICROALBUMIN, URINE WAIVED
Creatinine, Urine Waived: 300 mg/dL (ref 10–300)
Microalb, Ur Waived: 80 mg/L — ABNORMAL HIGH (ref 0–19)

## 2018-09-19 MED ORDER — ASPIRIN 81 MG PO TBEC: 81.0000 mg | DELAYED_RELEASE_TABLET | Freq: Every day | ORAL | 3 refills | Status: DC

## 2018-09-19 MED ORDER — METOPROLOL TARTRATE 25 MG PO TABS: 12.5000 mg | ORAL_TABLET | Freq: Two times a day (BID) | ORAL | 1 refills | Status: DC

## 2018-09-19 MED ORDER — METFORMIN HCL ER 500 MG PO TB24: 1000.0000 mg | ORAL_TABLET | Freq: Two times a day (BID) | ORAL | 1 refills | Status: DC

## 2018-09-19 MED ORDER — LISINOPRIL 5 MG PO TABS: 5.0000 mg | ORAL_TABLET | Freq: Every day | ORAL | 1 refills | Status: DC

## 2018-09-19 NOTE — Assessment & Plan Note (Signed)
Doing OK on metoprolol, but not on an ACE- will start low dose lisinopril and recheck 1 month.

## 2018-09-19 NOTE — Assessment & Plan Note (Signed)
Will recheck LFTs today- if back to normal, will start atorvastatin. Await results.

## 2018-09-19 NOTE — Assessment & Plan Note (Signed)
Doing better. Will increase his metformin to 1000mg  BID of the XR and recheck A1c in 2.5 months.

## 2018-09-19 NOTE — Assessment & Plan Note (Signed)
Rechecking levels today. Await results.  

## 2018-09-19 NOTE — Progress Notes (Addendum)
BP 137/89   Pulse 90   Temp 98.4 F (36.9 C) (Oral)   Ht 6' (1.829 m)   Wt (!) 319 lb (144.7 kg)   SpO2 97%   BMI 43.26 kg/m    Subjective:    Patient ID: Cory Snyder, male    DOB: 1976-03-19, 42 y.o.   MRN: [id number]  HPI: Cory Snyder is a 42 y.o. male  Chief Complaint  Patient presents with  . New Patient (Initial Visit)  . Diabetes    New diagnoisi in Sept.    HOSPITAL FOLLOW UP Time since discharge: 13 days Hospital/facility: [facility] Diagnosis:  DKA in type II diabetes--resolved Type II Dm new onset Hyperlipidemia--new HTN Obesity Procedures/tests:  EXAM: ULTRASOUND ABDOMEN LIMITED RIGHT UPPER QUADRANT  COMPARISON:  None.  FINDINGS: Gallbladder:  No gallstones or wall thickening visualized. No sonographic Murphy sign noted by sonographer.  Common bile duct:  Diameter: Normal caliber, 4 mm  Liver:  Increased echotexture compatible with fatty infiltration. No focal abnormality or biliary ductal dilatation. Portal vein is patent on color Doppler imaging with normal direction of blood flow towards the liver.  IMPRESSION: Fatty infiltration of the liver.  No acute findings.                  [facility]*                       [address]                        [place], [place] [address]                            [phone]  ------------------------------------------------------------------- Transthoracic Echocardiography  Patient:    Cory, Snyder MR #:       [id number] Study Date: 09/05/2018 Gender:     M Age:        42 Height:     185.4 cm Weight:     146.5 kg BSA:        2.81 m^2 Pt. Status: Room:   SONOGRAPHER  [facility]  PERFORMING   [facility]  ATTENDING    Samaan, Maged  ORDERING     Samaan, Maged  REFERRING    Samaan, Maged  cc:  ------------------------------------------------------------------- LV EF:  65%  ------------------------------------------------------------------- Indications:      CAD native vessel 414.01.  ------------------------------------------------------------------- History:   PMH:  Alcohol abuse, morbid obesity, pt denies medical problems.  Risk factors:  Diabetes mellitus.  ------------------------------------------------------------------- Study Conclusions  - Left ventricle: The cavity size was normal. Systolic function was   normal. The estimated ejection fraction was 65%. Regional wall   motion abnormalities cannot be excluded. Doppler parameters are   consistent with abnormal left ventricular relaxation (grade 1   diastolic dysfunction). - Mitral valve: Valve area by pressure half-time: 1.57 cm^2.  Impressions:  - Normal LVEf, mild diastolic dysfunction, wallmotion abnormalities   suggestive of CAD, fibrocalcified bicuspid aortic valve without   any significant AS.  ------------------------------------------------------------------- Study data:   Study status:  Routine.  Procedure:  The patient reported no pain pre or post test. Transthoracic echocardiography for left ventricular function evaluation, for right ventricular function evaluation, and for assessment of valvular function. Image quality was fair.  Study completion:  There were no complications.         Transthoracic echocardiography.  M-mode, complete 2D,  spectral Doppler, and color Doppler.  Birthdate:  Patient birthdate: 03-31-1976.  Age:  Patient is 42 yr old.  Sex:  Gender: male.    BMI: 42.6 kg/m^2.  Blood pressure:     137/86  Patient status:  Inpatient.  Study date:  Study date: 09/05/2018. Study time: 07:51 AM.  Location:  ICU/CCU  -------------------------------------------------------------------  ------------------------------------------------------------------- Left ventricle:  The cavity size was normal. Systolic function was normal. The estimated ejection fraction  was 65%. Regional wall motion abnormalities cannot be excluded. Doppler parameters are consistent with abnormal left ventricular relaxation (grade 1 diastolic dysfunction).  ------------------------------------------------------------------- Aortic valve:   Mildly thickened, mildly calcified leaflets. Sclerosis without stenosis.  Doppler:     VTI ratio of LVOT to aortic valve: 0.52. Valve area (VTI): 1.63 cm^2. Indexed valve area (VTI): 0.58 cm^2/m^2. Peak velocity ratio of LVOT to aortic valve: 0.42. Valve area (Vmax): 1.33 cm^2. Indexed valve area (Vmax): 0.47 cm^2/m^2. Mean velocity ratio of LVOT to aortic valve: 0.44. Valve area (Vmean): 1.37 cm^2. Indexed valve area (Vmean): 0.49 cm^2/m^2.    Mean gradient (S): 15 mm Hg. Peak gradient (S): 27 mm Hg.   ------------------------------------------------------------------- Mitral valve:   Doppler:  There was trivial regurgitation.    Valve area by pressure half-time: 1.57 cm^2. Indexed valve area by pressure half-time: 0.56 cm^2/m^2.    Peak gradient (D): 4 mm Hg.   ------------------------------------------------------------------- Pulmonary veins: Common pulmonary vein:  The Doppler velocity and flow profile were normal.  ------------------------------------------------------------------- Right ventricle:  The cavity size was normal. Wall thickness was normal. Systolic function was normal.  ------------------------------------------------------------------- Pulmonic valve:    Doppler:  There was trivial regurgitation.  ------------------------------------------------------------------- Tricuspid valve:   Doppler:  There was trivial regurgitation.  ------------------------------------------------------------------- Measurements   Left ventricle                           Value          Reference  LV ID, ED, PLAX chordal                  48.1  mm       43 - 52  LV ID, ES, PLAX chordal                  31.9  mm       23 -  38  LV fx shortening, PLAX chordal           34    %        >=29  LV PW thickness, ED                      16.4  mm       ----------  IVS/LV PW ratio, ED                      1.08           <=1.3  Stroke volume, 2D                        66    ml       ----------  Stroke volume/bsa, 2D                    23    ml/m^2   ----------  LV e&', lateral  9.46  cm/s     ----------  LV E/e&', lateral                         10.31          ----------  LV e&', medial                            6.64  cm/s     ----------  LV E/e&', medial                          14.68          ----------  LV e&', average                           8.05  cm/s     ----------  LV E/e&', average                         12.11          ----------    Ventricular septum                       Value          Reference  IVS thickness, ED                        17.7  mm       ----------    LVOT                                     Value          Reference  LVOT ID, S                               20    mm       ----------  LVOT area                                3.14  cm^2     ----------  LVOT ID                                  20    mm       ----------  LVOT peak velocity, S                    109   cm/s     ----------  LVOT mean velocity, S                    76.7  cm/s     ----------  LVOT VTI, S                              21    cm       ----------  Stroke volume (SV), LVOT DP              66    ml       ----------  Stroke index (SV/bsa), LVOT DP           23.5  ml/m^2   ----------    Aortic valve                             Value          Reference  Aortic valve peak velocity, S            258   cm/s     ----------  Aortic valve mean velocity, S            176   cm/s     ----------  Aortic valve VTI, S                      40.5  cm       ----------  Aortic mean gradient, S                  15    mm Hg    ----------  Aortic peak gradient, S                  27    mm Hg    ----------  VTI ratio,  LVOT/AV                       0.52           ----------  Aortic valve area, VTI                   1.63  cm^2     ----------  Aortic valve area/bsa, VTI               0.58  cm^2/m^2 ----------  Velocity ratio, peak, LVOT/AV            0.42           ----------  Aortic valve area, peak velocity         1.33  cm^2     ----------  Aortic valve area/bsa, peak              0.47  cm^2/m^2 ----------  velocity  Velocity ratio, mean, LVOT/AV            0.44           ----------  Aortic valve area, mean velocity         1.37  cm^2     ----------  Aortic valve area/bsa, mean              0.49  cm^2/m^2 ----------  velocity    Aorta                                    Value          Reference  Aortic root ID, ED                       33    mm       ----------    Left atrium                              Value          Reference  LA ID, A-P, ES  33    mm       ----------  LA ID/bsa, A-P                           1.17  cm/m^2   <=2.2  LA volume, S                             96    ml       ----------  LA volume/bsa, S                         34.2  ml/m^2   ----------  LA volume, ES, 1-p A4C                   86    ml       ----------  LA volume/bsa, ES, 1-p A4C               30.6  ml/m^2   ----------  LA volume, ES, 1-p A2C                   98.4  ml       ----------  LA volume/bsa, ES, 1-p A2C               35    ml/m^2   ----------    Mitral valve                             Value          Reference  Mitral E-wave peak velocity              97.5  cm/s     ----------  Mitral A-wave peak velocity              73.7  cm/s     ----------  Mitral deceleration time                 229   ms       150 - 230  Mitral pressure half-time                67    ms       ----------  Mitral peak gradient, D                  4     mm Hg    ----------  Mitral E/A ratio, peak                   1.3            ----------  Mitral valve area, PHT, DP               1.57  cm^2     ----------  Mitral  valve area/bsa, PHT, DP           0.56  cm^2/m^2 ----------    Right atrium                             Value          Reference  RA ID, S-I, ES, A4C              (H)     64.1  mm  34 - 49  RA area, ES, A4C                 (H)     26.8  cm^2     8.3 - 19.5  RA volume, ES, A/L                       89.3  ml       ----------  RA volume/bsa, ES, A/L                   31.8  ml/m^2   ----------    Right ventricle                          Value          Reference  RV ID, ED, PLAX                          28.5  mm       19 - 38  RV ID, minor axis, ED, A4C base          32    mm       ----------  TAPSE                                    38.8  mm       ----------  RV s&', lateral, S                        15.7  cm/s     ----------    Pulmonic valve                           Value          Reference  Pulmonic valve peak velocity, S          87.3  cm/s     ----------  Legend: (L)  and  (H)  mark values outside specified reference range.  ------------------------------------------------------------------- Prepared and Electronically Authenticated by  Adrian Blackwater, MD 2019-11-19T10:00:32  Consultants: Cardiology New medications: aspirin, metformin, metoprolol Discharge instructions:  Follow up here, needs recheck on LFTs, ?start statin, recheck potassium Status: better  DIABETES Hypoglycemic episodes:no Polydipsia/polyuria: yes Visual disturbance: no Chest pain: no Paresthesias: no Glucose Monitoring: yes  Accucheck frequency: BID- 120-in AM, 120-140 in the PM Taking Insulin?: no Blood Pressure Monitoring: not checking Retinal Examination: Not up to Date Foot Exam: Done today Diabetic Education: Not Completed Pneumovax: Up to Date Influenza: Up to Date Aspirin: yes  HYPERTENSION Hypertension status: controlled  Satisfied with current treatment? yes Duration of hypertension: chronic BP monitoring frequency:  not checking BP medication side effects:  no Medication  compliance: good compliance Previous BP meds: metoprolol Aspirin: yes Recurrent headaches: no Visual changes: no Palpitations: no Dyspnea: no Chest pain: no Lower extremity edema: no Dizzy/lightheaded: no  Depression screen [facility] 2/9 09/19/2018  Decreased Interest 0  Down, Depressed, Hopeless 0  PHQ - 2 Score 0  Altered sleeping 3  Tired, decreased energy 1  Change in appetite 1  Feeling bad or failure about yourself  0  Trouble concentrating 1  Moving slowly or fidgety/restless 2  Suicidal thoughts 0  PHQ-9 Score 8  Difficult doing work/chores Not difficult at  all   GAD 7 : Generalized Anxiety Score 09/19/2018  Nervous, Anxious, on Edge 2  Control/stop worrying 2  Worry too much - different things 2  Trouble relaxing 2  Restless 1  Easily annoyed or irritable 1  Afraid - awful might happen 1  Total GAD 7 Score 11  Anxiety Difficulty Somewhat difficult    Active Ambulatory Problems    Diagnosis Date Noted  . Transaminitis 09/03/2018  . Hypokalemia 09/03/2018  . Alcohol abuse 09/03/2018  . Uncontrolled type 2 diabetes mellitus with hyperglycemia (HCC) 09/19/2018  . Hyperlipidemia associated with type 2 diabetes mellitus (HCC) 09/19/2018  . HTN (hypertension) 09/19/2018   Resolved Ambulatory Problems    Diagnosis Date Noted  . Nausea & vomiting 09/03/2018  . Hyperglycemia 09/03/2018  . DKA (diabetic ketoacidoses) (HCC) 09/04/2018   Past Medical History:  Diagnosis Date  . Morbid obesity (HCC)   . Patient denies medical problems   . Type 2 diabetes mellitus with hyperglycemia [facility])    Past Surgical History:  Procedure Laterality Date  . ANKLE SURGERY     Outpatient Encounter Medications as of 09/19/2018  Medication Sig  . aspirin 81 MG EC tablet Take 1 tablet (81 mg total) by mouth daily.  . metoprolol tartrate (LOPRESSOR) 25 MG tablet Take 0.5 tablets (12.5 mg total) by mouth 2 (two) times daily.  . [DISCONTINUED] aspirin EC 81 MG EC tablet Take 1 tablet (81  mg total) by mouth daily.  . [DISCONTINUED] metFORMIN (GLUCOPHAGE) 500 MG tablet Take 1 tablet (500 mg total) by mouth 2 (two) times daily with a meal.  . [DISCONTINUED] metoprolol tartrate (LOPRESSOR) 25 MG tablet Take 0.5 tablets (12.5 mg total) by mouth 2 (two) times daily.  Marland Kitchen lisinopril (PRINIVIL,ZESTRIL) 5 MG tablet Take 1 tablet (5 mg total) by mouth daily.  . metFORMIN (GLUCOPHAGE-XR) 500 MG 24 hr tablet Take 2 tablets (1,000 mg total) by mouth 2 (two) times daily.  . Multiple Vitamin (MULTIVITAMIN WITH MINERALS) TABS tablet Take 1 tablet by mouth daily. (Patient not taking: Reported on 09/19/2018)   No facility-administered encounter medications on file as of 09/19/2018.    No Known Allergies  Social History   Socioeconomic History  . Marital status: Single    Spouse name: Not on file  . Number of children: Not on file  . Years of education: Not on file  . Highest education level: Not on file  Occupational History  . Not on file  Social Needs  . Financial resource strain: Not on file  . Food insecurity:    Worry: Not on file    Inability: Not on file  . Transportation needs:    Medical: Not on file    Non-medical: Not on file  Tobacco Use  . Smoking status: Former Smoker    Last attempt to quit: 09/20/2011    Years since quitting: 7.0  . Smokeless tobacco: Never Used  Substance and Sexual Activity  . Alcohol use: Not Currently    Frequency: Never    Comment: Sober x 2 weeks as on 09/19/18  . Drug use: Not Currently  . Sexual activity: Not on file  Lifestyle  . Physical activity:    Days per week: Not on file    Minutes per session: Not on file  . Stress: Not on file  Relationships  . Social connections:    Talks on phone: Not on file    Gets together: Not on file    Attends religious service: Not on  file    Active member of club or organization: Not on file    Attends meetings of clubs or organizations: Not on file    Relationship status: Not on file  Other  Topics Concern  . Not on file  Social History Narrative  . Not on file   Family History  Problem Relation Age of Onset  . CAD Other   . Heart attack Mother   . CAD Father   . Diabetes Father     Review of Systems  Constitutional: Positive for fatigue. Negative for activity change, appetite change, chills, diaphoresis, fever and unexpected weight change.  Respiratory: Negative.   Cardiovascular: Negative.   Psychiatric/Behavioral: Negative.     Per HPI unless specifically indicated above     Objective:    BP 137/89   Pulse 90   Temp 98.4 F (36.9 C) (Oral)   Ht 6' (1.829 m)   Wt (!) 319 lb (144.7 kg)   SpO2 97%   BMI 43.26 kg/m   Wt Readings from Last 3 Encounters:  09/19/18 (!) 319 lb (144.7 kg)  09/04/18 (!) 322 lb 15.6 oz (146.5 kg)    Physical Exam  Constitutional: He is oriented to person, place, and time. He appears well-developed and well-nourished. No distress.  HENT:  Head: Normocephalic and atraumatic.  Right Ear: Hearing normal.  Left Ear: Hearing normal.  Nose: Nose normal.  Eyes: Conjunctivae and lids are normal. Right eye exhibits no discharge. Left eye exhibits no discharge. No scleral icterus.  Cardiovascular: Normal rate, regular rhythm, normal heart sounds and intact distal pulses. Exam reveals no gallop and no friction rub.  No murmur heard. Pulmonary/Chest: Effort normal and breath sounds normal. No stridor. No respiratory distress. He has no wheezes. He has no rales. He exhibits no tenderness.  Musculoskeletal: Normal range of motion.  Neurological: He is alert and oriented to person, place, and time.  Skin: Skin is warm, dry and intact. Capillary refill takes less than 2 seconds. No rash noted. He is not diaphoretic. No erythema. No pallor.  Psychiatric: He has a normal mood and affect. His speech is normal and behavior is normal. Judgment and thought content normal. Cognition and memory are normal.  Nursing note and vitals  reviewed.  Diabetic Foot Exam - Simple   Simple Foot Form Diabetic Foot exam was performed with the following findings:  Yes 09/19/2018  9:38 AM  Visual Inspection No deformities, no ulcerations, no other skin breakdown bilaterally:  Yes Sensation Testing Intact to touch and monofilament testing bilaterally:  Yes Pulse Check Posterior Tibialis and Dorsalis pulse intact bilaterally:  Yes Comments      Results for orders placed or performed during the hospital encounter of 09/03/18  Culture, blood (Routine X 2) w Reflex to ID Panel  Result Value Ref Range   Specimen Description BLOOD RIGHT HAND    Special Requests      BOTTLES DRAWN AEROBIC AND ANAEROBIC Blood Culture adequate volume   Culture      NO GROWTH 5 DAYS Performed at [facility], [address] [place], [place] [address]    Report Status 09/08/2018 FINAL   Culture, blood (Routine X 2) w Reflex to ID Panel  Result Value Ref Range   Specimen Description BLOOD LEFT HAND    Special Requests      BOTTLES DRAWN AEROBIC AND ANAEROBIC Blood Culture adequate volume   Culture      NO GROWTH 5 DAYS Performed at [facility],  [address], [place], [place] [address]    Report Status 09/08/2018 FINAL   MRSA PCR Screening  Result Value Ref Range   MRSA by PCR NEGATIVE NEGATIVE  Glucose, capillary  Result Value Ref Range   Glucose-Capillary 321 (H) 70 - 99 mg/dL  Basic metabolic panel  Result Value Ref Range   Sodium 135 135 - 145 mmol/L   Potassium 2.7 (LL) 3.5 - 5.1 mmol/L   Chloride 90 (L) 98 - 111 mmol/L   CO2 24 22 - 32 mmol/L   Glucose, Bld 343 (H) 70 - 99 mg/dL   BUN <5 (L) 6 - 20 mg/dL   Creatinine, Ser 4.69 (L) 0.61 - 1.24 mg/dL   Calcium 62.9 8.9 - 52.8 mg/dL   GFR calc non Af Amer >60 >60 mL/min   GFR calc Af Amer >60 >60 mL/min   Anion gap 21 (H) 5 - 15  CBC  Result Value Ref Range   WBC 7.5 4.0 - 10.5 K/uL   RBC 5.30 4.22 - 5.81 MIL/uL   Hemoglobin 16.7 13.0 - 17.0 g/dL   HCT  41.3 24.4 - 01.0 %   MCV 89.8 80.0 - 100.0 fL   MCH 31.5 26.0 - 34.0 pg   MCHC 35.1 30.0 - 36.0 g/dL   RDW 27.2 53.6 - 64.4 %   Platelets 152 150 - 400 K/uL   nRBC 0.0 0.0 - 0.2 %  Urinalysis, Complete w Microscopic  Result Value Ref Range   Color, Urine YELLOW (A) YELLOW   APPearance CLEAR (A) CLEAR   Specific Gravity, Urine 1.029 1.005 - 1.030   pH 7.0 5.0 - 8.0   Glucose, UA >=500 (A) NEGATIVE mg/dL   Hgb urine dipstick SMALL (A) NEGATIVE   Bilirubin Urine NEGATIVE NEGATIVE   Ketones, ur 20 (A) NEGATIVE mg/dL   Protein, ur 30 (A) NEGATIVE mg/dL   Nitrite NEGATIVE NEGATIVE   Leukocytes, UA NEGATIVE NEGATIVE   RBC / HPF 0-5 0 - 5 RBC/hpf   WBC, UA 0-5 0 - 5 WBC/hpf   Bacteria, UA NONE SEEN NONE SEEN   Squamous Epithelial / LPF 0-5 0 - 5  Blood gas, venous  Result Value Ref Range   FIO2 0.21    Delivery systems ROOM AIR    pH, Ven 7.52 (H) 7.250 - 7.430   pCO2, Ven 32 (L) 44.0 - 60.0 mmHg   pO2, Ven 56.0 (H) 32.0 - 45.0 mmHg   Bicarbonate 26.1 20.0 - 28.0 mmol/L   Acid-Base Excess 3.9 (H) 0.0 - 2.0 mmol/L   O2 Saturation 91.9 %   Patient temperature 37.0    Collection site VENOUS    Sample type VENOUS   Lipase, blood  Result Value Ref Range   Lipase 64 (H) 11 - 51 U/L  Hepatic function panel  Result Value Ref Range   Total Protein 8.6 (H) 6.5 - 8.1 g/dL   Albumin 4.1 3.5 - 5.0 g/dL   AST 034 (H) 15 - 41 U/L   ALT 117 (H) 0 - 44 U/L   Alkaline Phosphatase 83 38 - 126 U/L   Total Bilirubin 2.6 (H) 0.3 - 1.2 mg/dL   Bilirubin, Direct 1.4 (H) 0.0 - 0.2 mg/dL   Indirect Bilirubin 1.2 (H) 0.3 - 0.9 mg/dL  Glucose, capillary  Result Value Ref Range   Glucose-Capillary 272 (H) 70 - 99 mg/dL  Hepatitis panel, acute  Result Value Ref Range   Hepatitis B Surface Ag Negative Negative   HCV Ab <  0.1 0.0 - 0.9 s/co ratio   Hep A IgM Negative Negative   Hep B C IgM Negative Negative  Differential  Result Value Ref Range   Neutrophils Relative % 71 %   Lymphocytes  Relative 15 %   Lymphs Abs 1.1 0.7 - 4.0 K/uL   Monocytes Relative 11 %   Monocytes Absolute 0.8 0.1 - 1.0 K/uL   Eosinophils Relative 2 %   Basophils Relative 1 %   Basophils Absolute 0.1 0.0 - 0.1 K/uL   Band Neutrophils 0 %   Metamyelocytes Relative 0 %   Myelocytes 0 %   Promyelocytes Relative 0 %   Blasts 0 %   nRBC 0 0 /100 WBC   Other 0 %   Neutro Abs 5.3 1.7 - 7.7 K/uL   Eosinophils Absolute 0.2 0.0 - 0.5 K/uL  Lactic acid, plasma  Result Value Ref Range   Lactic Acid, Venous 3.0 (HH) 0.5 - 1.9 mmol/L  Comprehensive metabolic panel  Result Value Ref Range   Sodium 135 135 - 145 mmol/L   Potassium 2.9 (L) 3.5 - 5.1 mmol/L   Chloride 93 (L) 98 - 111 mmol/L   CO2 24 22 - 32 mmol/L   Glucose, Bld 250 (H) 70 - 99 mg/dL   BUN <5 (L) 6 - 20 mg/dL   Creatinine, Ser 1.61 (L) 0.61 - 1.24 mg/dL   Calcium 9.0 8.9 - 09.6 mg/dL   Total Protein 7.5 6.5 - 8.1 g/dL   Albumin 3.7 3.5 - 5.0 g/dL   AST 045 (H) 15 - 41 U/L   ALT 96 (H) 0 - 44 U/L   Alkaline Phosphatase 70 38 - 126 U/L   Total Bilirubin 3.1 (H) 0.3 - 1.2 mg/dL   GFR calc non Af Amer >60 >60 mL/min   GFR calc Af Amer >60 >60 mL/min   Anion gap 18 (H) 5 - 15  HIV antibody (Routine Testing)  Result Value Ref Range   HIV Screen 4th Generation wRfx Non Reactive Non Reactive  CBC  Result Value Ref Range   WBC 6.3 4.0 - 10.5 K/uL   RBC 4.66 4.22 - 5.81 MIL/uL   Hemoglobin 14.6 13.0 - 17.0 g/dL   HCT 40.9 81.1 - 91.4 %   MCV 91.8 80.0 - 100.0 fL   MCH 31.3 26.0 - 34.0 pg   MCHC 34.1 30.0 - 36.0 g/dL   RDW 78.2 95.6 - 21.3 %   Platelets 103 (L) 150 - 400 K/uL   nRBC 0.0 0.0 - 0.2 %  Hemoglobin A1c  Result Value Ref Range   Hgb A1c MFr Bld 10.6 (H) 4.8 - 5.6 %   Mean Plasma Glucose 257.52 mg/dL  Glucose, capillary  Result Value Ref Range   Glucose-Capillary 237 (H) 70 - 99 mg/dL  Basic metabolic panel  Result Value Ref Range   Sodium 135 135 - 145 mmol/L   Potassium 2.7 (LL) 3.5 - 5.1 mmol/L   Chloride 95 (L)  98 - 111 mmol/L   CO2 27 22 - 32 mmol/L   Glucose, Bld 242 (H) 70 - 99 mg/dL   BUN <5 (L) 6 - 20 mg/dL   Creatinine, Ser 0.86 (L) 0.61 - 1.24 mg/dL   Calcium 8.6 (L) 8.9 - 10.3 mg/dL   GFR calc non Af Amer >60 >60 mL/min   GFR calc Af Amer >60 >60 mL/min   Anion gap 13 5 - 15  Basic metabolic panel  Result Value Ref Range  Sodium 135 135 - 145 mmol/L   Potassium 2.7 (LL) 3.5 - 5.1 mmol/L   Chloride 96 (L) 98 - 111 mmol/L   CO2 28 22 - 32 mmol/L   Glucose, Bld 217 (H) 70 - 99 mg/dL   BUN <5 (L) 6 - 20 mg/dL   Creatinine, Ser 1.61 (L) 0.61 - 1.24 mg/dL   Calcium 8.6 (L) 8.9 - 10.3 mg/dL   GFR calc non Af Amer >60 >60 mL/min   GFR calc Af Amer >60 >60 mL/min   Anion gap 11 5 - 15  Lactic acid, plasma  Result Value Ref Range   Lactic Acid, Venous 1.3 0.5 - 1.9 mmol/L  Lactic acid, plasma  Result Value Ref Range   Lactic Acid, Venous 1.4 0.5 - 1.9 mmol/L  Magnesium  Result Value Ref Range   Magnesium 1.3 (L) 1.7 - 2.4 mg/dL  Phosphorus  Result Value Ref Range   Phosphorus 1.7 (L) 2.5 - 4.6 mg/dL  Lipid panel  Result Value Ref Range   Cholesterol 330 (H) 0 - 200 mg/dL   Triglycerides 096 (H) <150 mg/dL   HDL 80 >04 mg/dL   Total CHOL/HDL Ratio 4.1 RATIO   VLDL 30 0 - 40 mg/dL   LDL Cholesterol 540 (H) 0 - 99 mg/dL  Glucose, capillary  Result Value Ref Range   Glucose-Capillary 211 (H) 70 - 99 mg/dL  Glucose, capillary  Result Value Ref Range   Glucose-Capillary 245 (H) 70 - 99 mg/dL  Glucose, capillary  Result Value Ref Range   Glucose-Capillary 241 (H) 70 - 99 mg/dL  Glucose, capillary  Result Value Ref Range   Glucose-Capillary 205 (H) 70 - 99 mg/dL  Glucose, capillary  Result Value Ref Range   Glucose-Capillary 186 (H) 70 - 99 mg/dL  Potassium  Result Value Ref Range   Potassium 3.1 (L) 3.5 - 5.1 mmol/L  Potassium  Result Value Ref Range   Potassium 3.4 (L) 3.5 - 5.1 mmol/L  Glucose, capillary  Result Value Ref Range   Glucose-Capillary 170 (H) 70 - 99  mg/dL  Glucose, capillary  Result Value Ref Range   Glucose-Capillary 271 (H) 70 - 99 mg/dL  Basic metabolic panel  Result Value Ref Range   Sodium 137 135 - 145 mmol/L   Potassium 3.3 (L) 3.5 - 5.1 mmol/L   Chloride 100 98 - 111 mmol/L   CO2 28 22 - 32 mmol/L   Glucose, Bld 185 (H) 70 - 99 mg/dL   BUN <5 (L) 6 - 20 mg/dL   Creatinine, Ser 9.81 (L) 0.61 - 1.24 mg/dL   Calcium 8.8 (L) 8.9 - 10.3 mg/dL   GFR calc non Af Amer >60 >60 mL/min   GFR calc Af Amer >60 >60 mL/min   Anion gap 9 5 - 15  Magnesium  Result Value Ref Range   Magnesium 1.7 1.7 - 2.4 mg/dL  Phosphorus  Result Value Ref Range   Phosphorus 1.7 (L) 2.5 - 4.6 mg/dL  Glucose, capillary  Result Value Ref Range   Glucose-Capillary 255 (H) 70 - 99 mg/dL  Phosphorus  Result Value Ref Range   Phosphorus 1.9 (L) 2.5 - 4.6 mg/dL  Potassium  Result Value Ref Range   Potassium 3.1 (L) 3.5 - 5.1 mmol/L  Potassium  Result Value Ref Range   Potassium 3.5 3.5 - 5.1 mmol/L  Potassium  Result Value Ref Range   Potassium 3.2 (L) 3.5 - 5.1 mmol/L  Glucose, capillary  Result Value Ref Range  Glucose-Capillary 167 (H) 70 - 99 mg/dL  Glucose, capillary  Result Value Ref Range   Glucose-Capillary 185 (H) 70 - 99 mg/dL  Troponin I - Now Then Q6H  Result Value Ref Range   Troponin I <0.03 <0.03 ng/mL  Glucose, capillary  Result Value Ref Range   Glucose-Capillary 144 (H) 70 - 99 mg/dL  Basic metabolic panel  Result Value Ref Range   Sodium 138 135 - 145 mmol/L   Potassium 3.0 (L) 3.5 - 5.1 mmol/L   Chloride 101 98 - 111 mmol/L   CO2 26 22 - 32 mmol/L   Glucose, Bld 153 (H) 70 - 99 mg/dL   BUN 6 6 - 20 mg/dL   Creatinine, Ser 5.36 (L) 0.61 - 1.24 mg/dL   Calcium 9.3 8.9 - 64.4 mg/dL   GFR calc non Af Amer >60 >60 mL/min   GFR calc Af Amer >60 >60 mL/min   Anion gap 11 5 - 15  Phosphorus  Result Value Ref Range   Phosphorus 3.2 2.5 - 4.6 mg/dL  Magnesium  Result Value Ref Range   Magnesium 1.8 1.7 - 2.4 mg/dL   Potassium  Result Value Ref Range   Potassium 3.0 (L) 3.5 - 5.1 mmol/L  Glucose, capillary  Result Value Ref Range   Glucose-Capillary 139 (H) 70 - 99 mg/dL  Potassium  Result Value Ref Range   Potassium 3.5 3.5 - 5.1 mmol/L  Glucose, capillary  Result Value Ref Range   Glucose-Capillary 121 (H) 70 - 99 mg/dL  Glucose, capillary  Result Value Ref Range   Glucose-Capillary 141 (H) 70 - 99 mg/dL  CG4 I-STAT (Lactic acid)  Result Value Ref Range   Lactic Acid, Venous 5.51 (HH) 0.5 - 1.9 mmol/L   Comment NOTIFIED PHYSICIAN   ECHOCARDIOGRAM COMPLETE  Result Value Ref Range   Weight 5,167.58 oz   Height 73 in   BP 137/86 mmHg      Assessment & Plan:   Problem List Items Addressed This Visit      Cardiovascular and Mediastinum   HTN (hypertension)    Doing OK on metoprolol, but not on an ACE- will start low dose lisinopril and recheck 1 month.       Relevant Medications   metoprolol tartrate (LOPRESSOR) 25 MG tablet   aspirin 81 MG EC tablet   lisinopril (PRINIVIL,ZESTRIL) 5 MG tablet     Endocrine   Uncontrolled type 2 diabetes mellitus with hyperglycemia (HCC) - Primary    Doing better. Will increase his metformin to 1000mg  BID of the XR and recheck A1c in 2.5 months.       Relevant Medications   metFORMIN (GLUCOPHAGE-XR) 500 MG 24 hr tablet   aspirin 81 MG EC tablet   lisinopril (PRINIVIL,ZESTRIL) 5 MG tablet   Other Relevant Orders   Comprehensive metabolic panel   Microalbumin, Urine Waived   Hyperlipidemia associated with type 2 diabetes mellitus (HCC)    Will recheck LFTs today- if back to normal, will start atorvastatin. Await results.       Relevant Medications   metFORMIN (GLUCOPHAGE-XR) 500 MG 24 hr tablet   aspirin 81 MG EC tablet   lisinopril (PRINIVIL,ZESTRIL) 5 MG tablet     Other   Transaminitis    Rechecking levels today. Await results.       Hypokalemia    Rechecking levels today. Await results.           Follow up plan: Return in  about 4 weeks (around 10/17/2018)  for follow up BP and cholesterol.

## 2018-09-19 NOTE — Telephone Encounter (Signed)
Called Ms. Cory Snyder about speaking to Mr. Cory Snyder to fill out application. Was informed that they had gone to a doctor today and patient now has a PCP. They no longer need [facility] assistance.

## 2018-09-20 ENCOUNTER — Encounter: Payer: Self-pay | Admitting: Family Medicine

## 2018-09-20 ENCOUNTER — Telehealth: Payer: Self-pay | Admitting: Family Medicine

## 2018-09-20 LAB — COMPREHENSIVE METABOLIC PANEL
ALBUMIN: 3.9 g/dL (ref 3.5–5.5)
ALK PHOS: 79 IU/L (ref 39–117)
ALT: 92 IU/L — ABNORMAL HIGH (ref 0–44)
AST: 154 IU/L — AB (ref 0–40)
Albumin/Globulin Ratio: 1 — ABNORMAL LOW (ref 1.2–2.2)
BUN/Creatinine Ratio: 8 — ABNORMAL LOW (ref 9–20)
BUN: 6 mg/dL (ref 6–24)
Bilirubin Total: 2.4 mg/dL — ABNORMAL HIGH (ref 0.0–1.2)
CO2: 23 mmol/L (ref 20–29)
CREATININE: 0.79 mg/dL (ref 0.76–1.27)
Calcium: 10.1 mg/dL (ref 8.7–10.2)
Chloride: 97 mmol/L (ref 96–106)
GFR calc Af Amer: 129 mL/min/{1.73_m2} (ref >59)
GFR calc non Af Amer: 112 mL/min/{1.73_m2} (ref >59)
Globulin, Total: 4 g/dL (ref 1.5–4.5)
Glucose: 120 mg/dL — ABNORMAL HIGH (ref 65–99)
Potassium: 4.6 mmol/L (ref 3.5–5.2)
Sodium: 141 mmol/L (ref 134–144)
Total Protein: 7.9 g/dL (ref 6.0–8.5)

## 2018-09-20 NOTE — Telephone Encounter (Signed)
Please let him know that his liver function is better, but not good enough to start a statin yet- we'll check it next time. I've sent him a letter with his results.

## 2018-09-20 NOTE — Telephone Encounter (Signed)
Attempted x 3 to call patient. Line was busy

## 2018-09-20 NOTE — Telephone Encounter (Signed)
Called and left detailed message with information on patients vm. DPR was checked.   

## 2018-10-25 ENCOUNTER — Telehealth: Payer: Self-pay | Admitting: Pharmacy Technician

## 2018-10-25 ENCOUNTER — Telehealth: Payer: Self-pay | Admitting: Family Medicine

## 2018-10-25 NOTE — Telephone Encounter (Signed)
Patient failed to provide proof of income.  No additional medication assistance will be provided by [facility] without the required proof of income documentation.  Patient notified by letter.  Cory Snyder J. Cory Snyder Care Manager [facility] 

## 2018-10-25 NOTE — Telephone Encounter (Signed)
Patient called in he has an infected tooth that has been bothering him for 3 days would like to know if he can have something sent in to [facility] [address]. Please advise.

## 2018-10-26 ENCOUNTER — Encounter: Payer: Self-pay | Admitting: Family Medicine

## 2018-10-26 MED ORDER — AMOXICILLIN-POT CLAVULANATE 875-125 MG PO TABS: 1.0000 | ORAL_TABLET | Freq: Two times a day (BID) | ORAL | 0 refills | Status: DC

## 2018-10-26 NOTE — Telephone Encounter (Signed)
Rx sent to his pharmacy

## 2018-10-26 NOTE — Telephone Encounter (Signed)
Called pt no answer. Left detailed VM message (DPR reviewed) and ask pt to give [place] a call back if have any questions about this message.

## 2018-10-27 ENCOUNTER — Ambulatory Visit: Payer: Self-pay | Admitting: Family Medicine

## 2018-12-17 NOTE — Progress Notes (Signed)
BP 134/84 (BP Location: Left Arm, Cuff Size: Large)   Pulse 80   Temp 98.2 F (36.8 C) (Oral)   Wt (!) 329 lb 14.4 oz (149.6 kg)   SpO2 97%   BMI 44.74 kg/m    Subjective:    Patient ID: Cory Snyder, male    DOB: 31-Jan-1976, 43 y.o.   MRN: [id number]  HPI: Flamur Shehee is a 43 y.o. male  Chief Complaint  Patient presents with  . Anxiety  . Diabetes  . Hypertension   DIABETES Hypoglycemic episodes:no Polydipsia/polyuria: no Visual disturbance: no Chest pain: no Paresthesias: no Glucose Monitoring: yes  Accucheck frequency: Daily Taking Insulin?: no Blood Pressure Monitoring: a few times a week Retinal Examination: Not up to Date Foot Exam: Not up to Date Diabetic Education: Not Completed Pneumovax: Up to Date Influenza: Up to Date Aspirin: no  HYPERTENSION / HYPERLIPIDEMIA Satisfied with current treatment? yes Duration of hypertension: chronic BP monitoring frequency: not checking BP medication side effects: no Past BP meds: lisinopril, metoprolol Duration of hyperlipidemia: chronic Cholesterol medication side effects: no Cholesterol supplements: none Past cholesterol medications: none Medication compliance: good compliance Aspirin: no Recent stressors: yes Recurrent headaches: no Visual changes: no Palpitations: no Dyspnea: no Chest pain: no Lower extremity edema: no Dizzy/lightheaded: no  ANXIETY/STRESS- just diagnosed with anxiety in rehab. Just got out of rehab on Friday.  Duration:exacerbated Anxious mood: yes  Excessive worrying: yes Irritability: yes  Sweating: no Nausea: no Palpitations:no Hyperventilation: no Panic attacks: no Agoraphobia: no  Obscessions/compulsions: no Depressed mood: yes Depression screen [facility] 2/9 12/18/2018 09/19/2018  Decreased Interest 0 0  Down, Depressed, Hopeless 0 0  PHQ - 2 Score 0 0  Altered sleeping 2 3  Tired, decreased energy 2 1  Change in appetite 2 1  Feeling bad or failure about yourself  1 0    Trouble concentrating 2 1  Moving slowly or fidgety/restless 2 2  Suicidal thoughts 0 0  PHQ-9 Score 11 8  Difficult doing work/chores Somewhat difficult Not difficult at all   GAD 7 : Generalized Anxiety Score 12/18/2018 09/19/2018  Nervous, Anxious, on Edge 3 2  Control/stop worrying 3 2  Worry too much - different things 3 2  Trouble relaxing 3 2  Restless 2 1  Easily annoyed or irritable 2 1  Afraid - awful might happen 2 1  Total GAD 7 Score 18 11  Anxiety Difficulty Very difficult Somewhat difficult   Anhedonia: no Weight changes: no Insomnia: no   Hypersomnia: no Fatigue/loss of energy: yes Feelings of worthlessness: no Feelings of guilt: yes Impaired concentration/indecisiveness: yes Suicidal ideations: no  Crying spells: no Recent Stressors/Life Changes: yes   Relationship problems: no   Family stress: yes     Financial stress: yes    Job stress: yes    Recent death/loss: no  Relevant past medical, surgical, family and social history reviewed and updated as indicated. Interim medical history since our last visit reviewed. Allergies and medications reviewed and updated.  Review of Systems  Constitutional: Negative.   Respiratory: Negative.   Cardiovascular: Negative.   Skin: Negative.   Neurological: Negative.   Psychiatric/Behavioral: Positive for dysphoric mood. Negative for agitation, behavioral problems, confusion, decreased concentration, hallucinations, self-injury, sleep disturbance and suicidal ideas. The patient is nervous/anxious. The patient is not hyperactive.     Per HPI unless specifically indicated above     Objective:    BP 134/84 (BP Location: Left Arm, Cuff Size: Large)   Pulse 80  Temp 98.2 F (36.8 C) (Oral)   Wt (!) 329 lb 14.4 oz (149.6 kg)   SpO2 97%   BMI 44.74 kg/m   Wt Readings from Last 3 Encounters:  12/18/18 (!) 329 lb 14.4 oz (149.6 kg)  09/19/18 (!) 319 lb (144.7 kg)  09/04/18 (!) 322 lb 15.6 oz (146.5 kg)     Physical Exam Vitals signs and nursing note reviewed.  Constitutional:      General: He is not in acute distress.    Appearance: Normal appearance. He is not ill-appearing, toxic-appearing or diaphoretic.  HENT:     Head: Normocephalic and atraumatic.     Right Ear: External ear normal.     Left Ear: External ear normal.     Nose: Nose normal.     Mouth/Throat:     Mouth: Mucous membranes are moist.     Pharynx: Oropharynx is clear.  Eyes:     General: No scleral icterus.       Right eye: No discharge.        Left eye: No discharge.     Extraocular Movements: Extraocular movements intact.     Conjunctiva/sclera: Conjunctivae normal.     Pupils: Pupils are equal, round, and reactive to light.  Neck:     Musculoskeletal: Normal range of motion and neck supple.  Cardiovascular:     Rate and Rhythm: Normal rate and regular rhythm.     Pulses: Normal pulses.     Heart sounds: Normal heart sounds. No murmur. No friction rub. No gallop.   Pulmonary:     Effort: Pulmonary effort is normal. No respiratory distress.     Breath sounds: Normal breath sounds. No stridor. No wheezing, rhonchi or rales.  Chest:     Chest wall: No tenderness.  Musculoskeletal: Normal range of motion.  Skin:    General: Skin is warm and dry.     Capillary Refill: Capillary refill takes less than 2 seconds.     Coloration: Skin is not jaundiced or pale.     Findings: No bruising, erythema, lesion or rash.  Neurological:     General: No focal deficit present.     Mental Status: He is alert and oriented to person, place, and time. Mental status is at baseline.  Psychiatric:        Mood and Affect: Mood normal.        Behavior: Behavior normal.        Thought Content: Thought content normal.        Judgment: Judgment normal.     Results for orders placed or performed in visit on 09/19/18  Comprehensive metabolic panel  Result Value Ref Range   Glucose 120 (H) 65 - 99 mg/dL   BUN 6 6 - 24 mg/dL    Creatinine, Ser 5.42 0.76 - 1.27 mg/dL   GFR calc non Af Amer 112 >59 mL/min/1.73   GFR calc Af Amer 129 >59 mL/min/1.73   BUN/Creatinine Ratio 8 (L) 9 - 20   Sodium 141 134 - 144 mmol/L   Potassium 4.6 3.5 - 5.2 mmol/L   Chloride 97 96 - 106 mmol/L   CO2 23 20 - 29 mmol/L   Calcium 10.1 8.7 - 10.2 mg/dL   Total Protein 7.9 6.0 - 8.5 g/dL   Albumin 3.9 3.5 - 5.5 g/dL   Globulin, Total 4.0 1.5 - 4.5 g/dL   Albumin/Globulin Ratio 1.0 (L) 1.2 - 2.2   Bilirubin Total 2.4 (H) 0.0 - 1.2 mg/dL  Alkaline Phosphatase 79 39 - 117 IU/L   AST 154 (H) 0 - 40 IU/L   ALT 92 (H) 0 - 44 IU/L  Microalbumin, Urine Waived  Result Value Ref Range   Microalb, Ur Waived 80 (H) 0 - 19 mg/L   Creatinine, Urine Waived 300 10 - 300 mg/dL   Microalb/Creat Ratio 30-300 (H) <30 mg/g      Assessment & Plan:   Problem List Items Addressed This Visit      Cardiovascular and Mediastinum   HTN (hypertension)    Under good control on current regimen. Continue current regimen. Continue to monitor. Call with any concerns.       Relevant Orders   Comprehensive metabolic panel     Endocrine   DM type 2, controlled, with complication (HCC) - Primary    Doing great with A1c at 6.5 down from 10.6. Continue current regimen. Continue to monitor. Call with any concerns.       Hyperlipidemia associated with type 2 diabetes mellitus (HCC)    Rechecking levels today. Await results. Treat as needed. Call with any concerns.       Relevant Orders   Comprehensive metabolic panel   Lipid Panel w/o Chol/HDL Ratio     Other   Transaminitis    Rechecking levels today to see if we can start Statin. Call with any concerns.       Relevant Orders   Comprehensive metabolic panel   Anxiety    Newly diagnosed was treated with something at rehab, but not sure what it was. Will check and discuss options. Call with any concerns.           Follow up plan: Return in about 4 weeks (around 01/15/2019) for follow up  mood.

## 2018-12-18 ENCOUNTER — Telehealth: Payer: Self-pay | Admitting: Family Medicine

## 2018-12-18 ENCOUNTER — Ambulatory Visit (INDEPENDENT_AMBULATORY_CARE_PROVIDER_SITE_OTHER): Payer: Self-pay | Admitting: Family Medicine

## 2018-12-18 ENCOUNTER — Encounter: Payer: Self-pay | Admitting: Family Medicine

## 2018-12-18 VITALS — BP 134/84 | HR 80 | Temp 98.2°F | Wt 329.9 lb

## 2018-12-18 DIAGNOSIS — E1165 Type 2 diabetes mellitus with hyperglycemia: Secondary | ICD-10-CM

## 2018-12-18 DIAGNOSIS — E118 Type 2 diabetes mellitus with unspecified complications: Secondary | ICD-10-CM

## 2018-12-18 DIAGNOSIS — E785 Hyperlipidemia, unspecified: Secondary | ICD-10-CM

## 2018-12-18 DIAGNOSIS — F419 Anxiety disorder, unspecified: Secondary | ICD-10-CM

## 2018-12-18 DIAGNOSIS — I10 Essential (primary) hypertension: Secondary | ICD-10-CM

## 2018-12-18 DIAGNOSIS — R740 Nonspecific elevation of levels of transaminase and lactic acid dehydrogenase [LDH]: Secondary | ICD-10-CM

## 2018-12-18 DIAGNOSIS — E1169 Type 2 diabetes mellitus with other specified complication: Secondary | ICD-10-CM

## 2018-12-18 DIAGNOSIS — R7401 Elevation of levels of liver transaminase levels: Secondary | ICD-10-CM

## 2018-12-18 LAB — BAYER DCA HB A1C WAIVED: HB A1C (BAYER DCA - WAIVED): 6.5 % (ref <7.0)

## 2018-12-18 MED ORDER — HYDROXYZINE HCL 25 MG PO TABS: 25.0000 mg | ORAL_TABLET | Freq: Three times a day (TID) | ORAL | 3 refills | Status: DC | PRN

## 2018-12-18 NOTE — Telephone Encounter (Signed)
Message relayed to patient. Verbalized understanding and denied questions.   

## 2018-12-18 NOTE — Assessment & Plan Note (Signed)
Doing great with A1c at 6.5 down from 10.6. Continue current regimen. Continue to monitor. Call with any concerns.

## 2018-12-18 NOTE — Telephone Encounter (Signed)
Please let him know that he was on visteril and I've sent him a refill to his pharmacy. Thanks!

## 2018-12-18 NOTE — Assessment & Plan Note (Signed)
Newly diagnosed was treated with something at rehab, but not sure what it was. Will check and discuss options. Call with any concerns.

## 2018-12-18 NOTE — Telephone Encounter (Signed)
Copied from CRM [id number]. Topic: General - Call Back - No Documentation >> Dec 18, 2018 10:24 AM Angela Nevin wrote:   Dr. Lahoma Rocker, with [facility], returning call to office. She states that it was just to leave which medication patient was requesting. She states it is Hydroxyzine.

## 2018-12-18 NOTE — Assessment & Plan Note (Signed)
Rechecking levels today. Await results. Treat as needed. Call with any concerns.  

## 2018-12-18 NOTE — Assessment & Plan Note (Signed)
Under good control on current regimen. Continue current regimen. Continue to monitor. Call with any concerns.   

## 2018-12-18 NOTE — Assessment & Plan Note (Signed)
Rechecking levels today to see if we can start Statin. Call with any concerns.

## 2018-12-19 ENCOUNTER — Telehealth: Payer: Self-pay | Admitting: Family Medicine

## 2018-12-19 LAB — COMPREHENSIVE METABOLIC PANEL
ALT: 50 IU/L — AB (ref 0–44)
AST: 78 IU/L — AB (ref 0–40)
Albumin/Globulin Ratio: 1.3 (ref 1.2–2.2)
Albumin: 4.1 g/dL (ref 4.0–5.0)
Alkaline Phosphatase: 52 IU/L (ref 39–117)
BUN/Creatinine Ratio: 9 (ref 9–20)
BUN: 6 mg/dL (ref 6–24)
Bilirubin Total: 1.8 mg/dL — ABNORMAL HIGH (ref 0.0–1.2)
CALCIUM: 9.4 mg/dL (ref 8.7–10.2)
CO2: 20 mmol/L (ref 20–29)
CREATININE: 0.64 mg/dL — AB (ref 0.76–1.27)
Chloride: 102 mmol/L (ref 96–106)
GFR, EST AFRICAN AMERICAN: 140 mL/min/{1.73_m2} (ref >59)
GFR, EST NON AFRICAN AMERICAN: 121 mL/min/{1.73_m2} (ref >59)
GLUCOSE: 98 mg/dL (ref 65–99)
Globulin, Total: 3.2 g/dL (ref 1.5–4.5)
Potassium: 3.5 mmol/L (ref 3.5–5.2)
Sodium: 140 mmol/L (ref 134–144)
TOTAL PROTEIN: 7.3 g/dL (ref 6.0–8.5)

## 2018-12-19 LAB — LIPID PANEL W/O CHOL/HDL RATIO
Cholesterol, Total: 259 mg/dL — ABNORMAL HIGH (ref 100–199)
HDL: 34 mg/dL — AB (ref >39)
LDL Calculated: 181 mg/dL — ABNORMAL HIGH (ref 0–99)
Triglycerides: 220 mg/dL — ABNORMAL HIGH (ref 0–149)
VLDL CHOLESTEROL CAL: 44 mg/dL — AB (ref 5–40)

## 2018-12-19 MED ORDER — ATORVASTATIN CALCIUM 10 MG PO TABS: 10.0000 mg | ORAL_TABLET | Freq: Every day | ORAL | 3 refills | Status: DC

## 2018-12-19 NOTE — Telephone Encounter (Signed)
Message relayed to patient. Verbalized understanding and denied questions.   

## 2018-12-19 NOTE — Telephone Encounter (Signed)
Left message on machine for pt to return call to the office.  

## 2018-12-19 NOTE — Telephone Encounter (Signed)
Please let him know that his liver function has gotten a lot better, but still not perfect, but his cholesterol went up. I'm going to start him on a low dose cholesterol medicine and I want to recheck his liver function and cholesterol in 1 month. Thanks!

## 2018-12-22 ENCOUNTER — Ambulatory Visit: Payer: Self-pay | Admitting: *Deleted

## 2018-12-22 ENCOUNTER — Ambulatory Visit: Payer: Self-pay | Admitting: Family Medicine

## 2018-12-22 NOTE — Telephone Encounter (Signed)
Have him stop it for 2 days and see if it goes away- if it does, he can restart a 1/2 pill. If it comes back- stop it and let me know so I can send him something different.

## 2018-12-22 NOTE — Telephone Encounter (Signed)
Called and left a detailed message for patient letting him know \what Dr.Johnson said. 

## 2018-12-22 NOTE — Telephone Encounter (Signed)
Patient started Lipitor on 3/2 and he reports he is having chest tightness, pain in stomach. Symptoms started last night 2 hours after taking medication.  Patient took last dose of medication last night.  Reason for Disposition . Caller has URGENT medication question about med that PCP prescribed and triager unable to answer question  Answer Assessment - Initial Assessment Questions 1. SYMPTOMS: "Do you have any symptoms?"     Chest tightness, pain in stomach Patient thinks he is having side effect to new medication- he states he has recently started atorvaststin and he thinks that is causing his symptoms. Patient states he also started hydroxyzine but he has taken that before without complication.  Protocols used: MEDICATION QUESTION CALL-A-AH

## 2019-01-15 NOTE — Progress Notes (Signed)
BP 136/88 Comment: pt reported- virtual visit  Pulse 81 Comment: pt reported- virtual visit   Subjective:    Patient ID: Cory Snyder, male    DOB: Oct 02, 1976, 43 y.o.   MRN: [id number]  HPI: Cory MoralesRyan Shatswell is a 43 y.o. male  Chief Complaint  Patient presents with  . Anxiety  . Hyperlipidemia  . TELEMEDICINE VISIT   HYPERLIPIDEMIA Hyperlipidemia status: excellent compliance Satisfied with current treatment?  yes Side effects:  no Medication compliance: excellent compliance Past cholesterol meds: atorvastatin Supplements: none Aspirin:  no The 10-year ASCVD risk score Denman George(Goff DC Jr., et al., 2013) is: 9.6%   Values used to calculate the score:     Age: [age] years     Sex: Male     Is Non-Hispanic African American: No     Diabetic: Yes     Tobacco smoker: No     Systolic Blood Pressure: 136 mmHg     Is BP treated: Yes     HDL Cholesterol: 34 mg/dL     Total Cholesterol: 259 mg/dL Chest pain:  no Coronary artery disease:  no  ANXIETY/STRESS Duration:better Anxious mood: no  Excessive worrying: no Irritability: no  Sweating: no Nausea: no Palpitations:no Hyperventilation: no Panic attacks: no Agoraphobia: no  Obscessions/compulsions: no Depressed mood: no [facility] 01/17/2019 12/18/2018 09/19/2018  Decreased Interest 1 0 0  Down, Depressed, Hopeless 0 0 0  PHQ - 2 Score 1 0 0  Altered sleeping 0 2 3  Tired, decreased energy 1 2 1   Change in appetite 0 2 1  Feeling bad or failure about yourself  0 1 0  Trouble concentrating 1 2 1   Moving slowly or fidgety/restless 0 2 2  Suicidal thoughts 0 0 0  PHQ-9 Score 3 11 8   Difficult doing work/chores Not difficult at all Somewhat difficult Not difficult at all   GAD 7 : Generalized Anxiety Score 01/17/2019 12/18/2018 09/19/2018  Nervous, Anxious, on Edge 1 3 2   Control/stop worrying 0 3 2  Worry too much - different things 1 3 2   Trouble relaxing 0 3 2  Restless 0 2 1  Easily annoyed or irritable 0 2 1   Afraid - awful might happen 0 2 1  Total GAD 7 Score 2 18 11   Anxiety Difficulty Not difficult at all Very difficult Somewhat difficult   Anhedonia: no Weight changes: no Insomnia: no   Hypersomnia: no Fatigue/loss of energy: no Feelings of worthlessness: no Feelings of guilt: no Impaired concentration/indecisiveness: no Suicidal ideations: no  Crying spells: no Recent Stressors/Life Changes: no   Relationship problems: no   Family stress: no     Financial stress: no    Job stress: no    Recent death/loss: no   Relevant past medical, surgical, family and social history reviewed and updated as indicated. Interim medical history since our last visit reviewed. Allergies and medications reviewed and updated.  Review of Systems  Constitutional: Negative.   Respiratory: Negative.   Cardiovascular: Negative.   Musculoskeletal: Negative.   Neurological: Negative.   Psychiatric/Behavioral: Negative.     Per HPI unless specifically indicated above     Objective:    BP 136/88 Comment: pt reported- virtual visit  Pulse 81 Comment: pt reported- virtual visit  Wt Readings from Last 3 Encounters:  12/18/18 (!) 329 lb 14.4 oz (149.6 kg)  09/19/18 (!) 319 lb (144.7 kg)  09/04/18 (!) 322 lb 15.6 oz (146.5 kg)    Physical Exam Vitals  signs and nursing note reviewed.  Constitutional:      General: He is not in acute distress.    Appearance: Normal appearance. He is not ill-appearing, toxic-appearing or diaphoretic.  HENT:     Head: Normocephalic and atraumatic.     Right Ear: External ear normal.     Left Ear: External ear normal.     Nose: Nose normal.     Mouth/Throat:     Mouth: Mucous membranes are moist.     Pharynx: Oropharynx is clear.  Eyes:     General: No scleral icterus.       Right eye: No discharge.        Left eye: No discharge.     Conjunctiva/sclera: Conjunctivae normal.     Pupils: Pupils are equal, round, and reactive to light.  Neck:      Musculoskeletal: Normal range of motion.  Pulmonary:     Effort: Pulmonary effort is normal. No respiratory distress.     Comments: Speaking in full sentences Musculoskeletal: Normal range of motion.  Skin:    Coloration: Skin is not jaundiced or pale.     Findings: No bruising, erythema, lesion or rash.  Neurological:     Mental Status: He is alert and oriented to person, place, and time. Mental status is at baseline.  Psychiatric:        Mood and Affect: Mood normal.        Behavior: Behavior normal.        Thought Content: Thought content normal.        Judgment: Judgment normal.     Results for orders placed or performed in visit on 12/18/18  Bayer DCA Hb A1c Waived  Result Value Ref Range   HB A1C (BAYER DCA - WAIVED) 6.5 <7.0 %  Comprehensive metabolic panel  Result Value Ref Range   Glucose 98 65 - 99 mg/dL   BUN 6 6 - 24 mg/dL   Creatinine, Ser 8.33 (L) 0.76 - 1.27 mg/dL   GFR calc non Af Amer 121 >59 mL/min/1.73   GFR calc Af Amer 140 >59 mL/min/1.73   BUN/Creatinine Ratio 9 9 - 20   Sodium 140 134 - 144 mmol/L   Potassium 3.5 3.5 - 5.2 mmol/L   Chloride 102 96 - 106 mmol/L   CO2 20 20 - 29 mmol/L   Calcium 9.4 8.7 - 10.2 mg/dL   Total Protein 7.3 6.0 - 8.5 g/dL   Albumin 4.1 4.0 - 5.0 g/dL   Globulin, Total 3.2 1.5 - 4.5 g/dL   Albumin/Globulin Ratio 1.3 1.2 - 2.2   Bilirubin Total 1.8 (H) 0.0 - 1.2 mg/dL   Alkaline Phosphatase 52 39 - 117 IU/L   AST 78 (H) 0 - 40 IU/L   ALT 50 (H) 0 - 44 IU/L  Lipid Panel w/o Chol/HDL Ratio  Result Value Ref Range   Cholesterol, Total 259 (H) 100 - 199 mg/dL   Triglycerides 582 (H) 0 - 149 mg/dL   HDL 34 (L) >51 mg/dL   VLDL Cholesterol Cal 44 (H) 5 - 40 mg/dL   LDL Calculated 898 (H) 0 - 99 mg/dL      Assessment & Plan:   Problem List Items Addressed This Visit      Endocrine   Hyperlipidemia associated with type 2 diabetes mellitus (HCC) - Primary    Tolerating medicine well. Will recheck labs. Await results.  Call with any concerns. Continue to monitor.       Relevant Orders  Comprehensive metabolic panel   Lipid Panel w/o Chol/HDL Ratio     Other   Anxiety    Doing much better with hydroxyzine. Feeling better. Call with any concerns.           Follow up plan: Return June, for DM follow up .    Marland Kitchen This visit was completed via FaceTime due to the restrictions of the COVID-19 pandemic. All issues as above were discussed and addressed. Physical exam was done as above through visual confirmation on FaceTime. If it was felt that the patient should be evaluated in the office, they were directed there. The patient verbally consented to this visit. . Location of the patient: home . Location of the provider: home . Those involved with this call:  . Provider: Olevia Perches, DO . CMA: Wilhemena Durie, CMA . Front Desk/Registration: Adela Ports  . Time spent on call: 25 minutes with patient face to face via video conference. More than 50% of this time was spent in counseling and coordination of care.

## 2019-01-17 ENCOUNTER — Encounter: Payer: Self-pay | Admitting: Family Medicine

## 2019-01-17 ENCOUNTER — Ambulatory Visit (INDEPENDENT_AMBULATORY_CARE_PROVIDER_SITE_OTHER): Payer: Self-pay | Admitting: Family Medicine

## 2019-01-17 ENCOUNTER — Other Ambulatory Visit: Payer: Self-pay

## 2019-01-17 VITALS — BP 136/88 | HR 81

## 2019-01-17 DIAGNOSIS — E785 Hyperlipidemia, unspecified: Secondary | ICD-10-CM

## 2019-01-17 DIAGNOSIS — F419 Anxiety disorder, unspecified: Secondary | ICD-10-CM

## 2019-01-17 DIAGNOSIS — E1169 Type 2 diabetes mellitus with other specified complication: Secondary | ICD-10-CM

## 2019-01-17 NOTE — Assessment & Plan Note (Signed)
Tolerating medicine well. Will recheck labs. Await results. Call with any concerns. Continue to monitor.

## 2019-01-17 NOTE — Assessment & Plan Note (Signed)
Doing much better with hydroxyzine. Feeling better. Call with any concerns.

## 2019-01-18 ENCOUNTER — Other Ambulatory Visit: Payer: Self-pay

## 2019-01-18 DIAGNOSIS — E785 Hyperlipidemia, unspecified: Principal | ICD-10-CM

## 2019-01-18 DIAGNOSIS — E1169 Type 2 diabetes mellitus with other specified complication: Secondary | ICD-10-CM

## 2019-01-19 ENCOUNTER — Encounter: Payer: Self-pay | Admitting: Family Medicine

## 2019-01-19 LAB — COMPREHENSIVE METABOLIC PANEL
ALT: 25 IU/L (ref 0–44)
AST: 37 IU/L (ref 0–40)
Albumin/Globulin Ratio: 1.3 (ref 1.2–2.2)
Albumin: 4.3 g/dL (ref 4.0–5.0)
Alkaline Phosphatase: 51 IU/L (ref 39–117)
BUN/Creatinine Ratio: 11 (ref 9–20)
BUN: 7 mg/dL (ref 6–24)
Bilirubin Total: 0.6 mg/dL (ref 0.0–1.2)
CO2: 19 mmol/L — ABNORMAL LOW (ref 20–29)
Calcium: 9.7 mg/dL (ref 8.7–10.2)
Chloride: 99 mmol/L (ref 96–106)
Creatinine, Ser: 0.65 mg/dL — ABNORMAL LOW (ref 0.76–1.27)
GFR calc Af Amer: 139 mL/min/{1.73_m2} (ref >59)
GFR calc non Af Amer: 120 mL/min/{1.73_m2} (ref >59)
Globulin, Total: 3.2 g/dL (ref 1.5–4.5)
Glucose: 123 mg/dL — ABNORMAL HIGH (ref 65–99)
Potassium: 4.5 mmol/L (ref 3.5–5.2)
Sodium: 137 mmol/L (ref 134–144)
Total Protein: 7.5 g/dL (ref 6.0–8.5)

## 2019-01-19 LAB — LIPID PANEL W/O CHOL/HDL RATIO
Cholesterol, Total: 164 mg/dL (ref 100–199)
HDL: 42 mg/dL (ref >39)
LDL Calculated: 91 mg/dL (ref 0–99)
Triglycerides: 154 mg/dL — ABNORMAL HIGH (ref 0–149)
VLDL Cholesterol Cal: 31 mg/dL (ref 5–40)

## 2019-03-07 ENCOUNTER — Telehealth: Payer: Self-pay | Admitting: Family Medicine

## 2019-03-07 NOTE — Telephone Encounter (Signed)
LVM for patient to call to schedule follow up appointment with Dr Johnson °

## 2019-04-27 ENCOUNTER — Other Ambulatory Visit: Payer: Self-pay

## 2019-04-27 ENCOUNTER — Encounter: Payer: Self-pay | Admitting: Family Medicine

## 2019-04-27 ENCOUNTER — Ambulatory Visit (INDEPENDENT_AMBULATORY_CARE_PROVIDER_SITE_OTHER): Payer: Self-pay | Admitting: Family Medicine

## 2019-04-27 ENCOUNTER — Ambulatory Visit: Payer: Self-pay | Admitting: Family Medicine

## 2019-04-27 VITALS — BP 140/82

## 2019-04-27 DIAGNOSIS — E118 Type 2 diabetes mellitus with unspecified complications: Secondary | ICD-10-CM

## 2019-04-27 MED ORDER — METOPROLOL TARTRATE 25 MG PO TABS: 12.5000 mg | ORAL_TABLET | Freq: Two times a day (BID) | ORAL | 1 refills | Status: DC

## 2019-04-27 MED ORDER — HYDROXYZINE HCL 25 MG PO TABS: 25.0000 mg | ORAL_TABLET | Freq: Three times a day (TID) | ORAL | 3 refills | Status: DC | PRN

## 2019-04-27 MED ORDER — ATORVASTATIN CALCIUM 10 MG PO TABS: 10.0000 mg | ORAL_TABLET | Freq: Every day | ORAL | 1 refills | Status: DC

## 2019-04-27 MED ORDER — METFORMIN HCL ER 500 MG PO TB24: 1000.0000 mg | ORAL_TABLET | Freq: Two times a day (BID) | ORAL | 1 refills | Status: DC

## 2019-04-27 MED ORDER — LISINOPRIL 5 MG PO TABS: 5.0000 mg | ORAL_TABLET | Freq: Every day | ORAL | 1 refills | Status: DC

## 2019-04-27 NOTE — Progress Notes (Signed)
BP 140/82 Comment: pt reported   Subjective:    Patient ID: Cory Snyder, male    DOB: [birth year], 43 y.o.   MRN: [id number]  HPI: Cory Snyder is a 43 y.o. male  Chief Complaint  Patient presents with  . Diabetes   DIABETES Hypoglycemic episodes:no Polydipsia/polyuria: no Visual disturbance: no Chest pain: no Paresthesias: no Glucose Monitoring: yes  Accucheck frequency: Daily- 107 this AM Taking Insulin?: no Blood Pressure Monitoring: not checking Retinal Examination: Not up to Date Foot Exam: Up to Date Diabetic Education: Not Completed Pneumovax: Up to Date Influenza: Up to Date Aspirin: no   Relevant past medical, surgical, family and social history reviewed and updated as indicated. Interim medical history since our last visit reviewed. Allergies and medications reviewed and updated.  Review of Systems  Constitutional: Negative.   Respiratory: Negative.   Cardiovascular: Negative.   Gastrointestinal: Negative.   Neurological: Negative.   Psychiatric/Behavioral: Negative.     Per HPI unless specifically indicated above     Objective:    BP 140/82 Comment: pt reported  Wt Readings from Last 3 Encounters:  12/18/18 (!) 329 lb 14.4 oz (149.6 kg)  09/19/18 (!) 319 lb (144.7 kg)  09/04/18 (!) 322 lb 15.6 oz (146.5 kg)    Physical Exam Vitals signs and nursing note reviewed.  Constitutional:      General: He is not in acute distress.    Appearance: Normal appearance. He is not ill-appearing, toxic-appearing or diaphoretic.  HENT:     Head: Normocephalic and atraumatic.     Right Ear: External ear normal.     Left Ear: External ear normal.     Nose: Nose normal.     Mouth/Throat:     Mouth: Mucous membranes are moist.     Pharynx: Oropharynx is clear.  Eyes:     General: No scleral icterus.       Right eye: No discharge.        Left eye: No discharge.     Conjunctiva/sclera: Conjunctivae normal.     Pupils: Pupils are equal, round, and  reactive to light.  Neck:     Musculoskeletal: Normal range of motion.  Pulmonary:     Effort: Pulmonary effort is normal. No respiratory distress.     Comments: Speaking in full sentences Musculoskeletal: Normal range of motion.  Skin:    Coloration: Skin is not jaundiced or pale.     Findings: No bruising, erythema, lesion or rash.  Neurological:     Mental Status: He is alert and oriented to person, place, and time. Mental status is at baseline.  Psychiatric:        Mood and Affect: Mood normal.        Behavior: Behavior normal.        Thought Content: Thought content normal.        Judgment: Judgment normal.     Results for orders placed or performed in visit on 01/18/19  Lipid Panel w/o Chol/HDL Ratio  Result Value Ref Range   Cholesterol, Total 164 100 - 199 mg/dL   Triglycerides 782154 (H) 0 - 149 mg/dL   HDL 42 >95>39 mg/dL   VLDL Cholesterol Cal 31 5 - 40 mg/dL   LDL Calculated 91 0 - 99 mg/dL  Comprehensive metabolic panel  Result Value Ref Range   Glucose 123 (H) 65 - 99 mg/dL   BUN 7 6 - 24 mg/dL   Creatinine, Ser 6.210.65 (L) 0.76 - 1.27 mg/dL  GFR calc non Af Amer 120 >59 mL/min/1.73   GFR calc Af Amer 139 >59 mL/min/1.73   BUN/Creatinine Ratio 11 9 - 20   Sodium 137 134 - 144 mmol/L   Potassium 4.5 3.5 - 5.2 mmol/L   Chloride 99 96 - 106 mmol/L   CO2 19 (L) 20 - 29 mmol/L   Calcium 9.7 8.7 - 10.2 mg/dL   Total Protein 7.5 6.0 - 8.5 g/dL   Albumin 4.3 4.0 - 5.0 g/dL   Globulin, Total 3.2 1.5 - 4.5 g/dL   Albumin/Globulin Ratio 1.3 1.2 - 2.2   Bilirubin Total 0.6 0.0 - 1.2 mg/dL   Alkaline Phosphatase 51 39 - 117 IU/L   AST 37 0 - 40 IU/L   ALT 25 0 - 44 IU/L      Assessment & Plan:   Problem List Items Addressed This Visit      Endocrine   DM type 2, controlled, with complication ([place]) - Primary    Tolerating medicine well. Will get him in for A1c. Continue current regimen. Continue to monitor. Call with any concerns.       Relevant Medications    metFORMIN (GLUCOPHAGE-XR) 500 MG 24 hr tablet   lisinopril (ZESTRIL) 5 MG tablet   atorvastatin (LIPITOR) 10 MG tablet   Other Relevant Orders   Bayer DCA Hb A1c Waived       Follow up plan: Return in about 3 months (around 07/28/2019) for follow up DM/HTN/HLD.    . This visit was completed via FaceTime due to the restrictions of the COVID-19 pandemic. All issues as above were discussed and addressed. Physical exam was done as above through visual confirmation on FaceTime. If it was felt that the patient should be evaluated in the office, they were directed there. The patient verbally consented to this visit. . Location of the patient: home . Location of the provider: work . Those involved with this call:  . Provider: Park Liter, DO . CMA: Yvonna Alanis, [place] . Front Desk/Registration: Don Perking  . Time spent on call: 15 minutes with patient face to face via video conference. More than 50% of this time was spent in counseling and coordination of care. 23 minutes total spent in review of patient's record and preparation of their chart.

## 2019-04-27 NOTE — Assessment & Plan Note (Signed)
Tolerating medicine well. Will get him in for A1c. Continue current regimen. Continue to monitor. Call with any concerns.

## 2019-05-04 ENCOUNTER — Other Ambulatory Visit: Payer: Self-pay

## 2019-05-04 DIAGNOSIS — Z20822 Contact with and (suspected) exposure to covid-19: Secondary | ICD-10-CM

## 2019-05-06 ENCOUNTER — Other Ambulatory Visit: Payer: Self-pay

## 2019-05-06 ENCOUNTER — Emergency Department: Payer: Self-pay

## 2019-05-06 ENCOUNTER — Emergency Department
Admission: EM | Admit: 2019-05-06 | Discharge: 2019-05-06 | Disposition: A | Discharge status: Home / Self Care | Payer: Self-pay | Attending: Emergency Medicine | Admitting: Emergency Medicine

## 2019-05-06 DIAGNOSIS — Z79899 Other long term (current) drug therapy: Secondary | ICD-10-CM | POA: Insufficient documentation

## 2019-05-06 DIAGNOSIS — R410 Disorientation, unspecified: Secondary | ICD-10-CM | POA: Insufficient documentation

## 2019-05-06 DIAGNOSIS — F1010 Alcohol abuse, uncomplicated: Secondary | ICD-10-CM | POA: Insufficient documentation

## 2019-05-06 DIAGNOSIS — E860 Dehydration: Secondary | ICD-10-CM | POA: Insufficient documentation

## 2019-05-06 DIAGNOSIS — Z20828 Contact with and (suspected) exposure to other viral communicable diseases: Secondary | ICD-10-CM | POA: Insufficient documentation

## 2019-05-06 DIAGNOSIS — I10 Essential (primary) hypertension: Secondary | ICD-10-CM | POA: Insufficient documentation

## 2019-05-06 DIAGNOSIS — E119 Type 2 diabetes mellitus without complications: Secondary | ICD-10-CM | POA: Insufficient documentation

## 2019-05-06 DIAGNOSIS — Z7984 Long term (current) use of oral hypoglycemic drugs: Secondary | ICD-10-CM | POA: Insufficient documentation

## 2019-05-06 DIAGNOSIS — Z7982 Long term (current) use of aspirin: Secondary | ICD-10-CM | POA: Insufficient documentation

## 2019-05-06 DIAGNOSIS — Z87891 Personal history of nicotine dependence: Secondary | ICD-10-CM | POA: Insufficient documentation

## 2019-05-06 LAB — URINALYSIS, COMPLETE (UACMP) WITH MICROSCOPIC
Bilirubin Urine: NEGATIVE
Glucose, UA: NEGATIVE mg/dL
Hgb urine dipstick: NEGATIVE
Ketones, ur: 20 mg/dL — AB
Leukocytes,Ua: NEGATIVE
Nitrite: NEGATIVE
Protein, ur: 100 mg/dL — AB
Specific Gravity, Urine: 1.028 (ref 1.005–1.030)
pH: 5 (ref 5.0–8.0)

## 2019-05-06 LAB — CBC
HCT: 46.7 % (ref 39.0–52.0)
Hemoglobin: 15.6 g/dL (ref 13.0–17.0)
MCH: 29.7 pg (ref 26.0–34.0)
MCHC: 33.4 g/dL (ref 30.0–36.0)
MCV: 89 fL (ref 80.0–100.0)
Platelets: 99 10*3/uL — ABNORMAL LOW (ref 150–400)
RBC: 5.25 MIL/uL (ref 4.22–5.81)
RDW: 14.8 % (ref 11.5–15.5)
WBC: 7.3 10*3/uL (ref 4.0–10.5)
nRBC: 0 % (ref 0.0–0.2)

## 2019-05-06 LAB — COMPREHENSIVE METABOLIC PANEL
ALT: 82 U/L — ABNORMAL HIGH (ref 0–44)
AST: 135 U/L — ABNORMAL HIGH (ref 15–41)
Albumin: 4.4 g/dL (ref 3.5–5.0)
Alkaline Phosphatase: 52 U/L (ref 38–126)
Anion gap: 20 — ABNORMAL HIGH (ref 5–15)
BUN: 9 mg/dL (ref 6–20)
CO2: 17 mmol/L — ABNORMAL LOW (ref 22–32)
Calcium: 8.8 mg/dL — ABNORMAL LOW (ref 8.9–10.3)
Chloride: 101 mmol/L (ref 98–111)
Creatinine, Ser: 0.61 mg/dL (ref 0.61–1.24)
GFR calc Af Amer: >60 mL/min (ref >60)
GFR calc non Af Amer: >60 mL/min (ref >60)
Glucose, Bld: 156 mg/dL — ABNORMAL HIGH (ref 70–99)
Potassium: 3.5 mmol/L (ref 3.5–5.1)
Sodium: 138 mmol/L (ref 135–145)
Total Bilirubin: 1 mg/dL (ref 0.3–1.2)
Total Protein: 8.3 g/dL — ABNORMAL HIGH (ref 6.5–8.1)

## 2019-05-06 LAB — ETHANOL: Alcohol, Ethyl (B): 212 mg/dL — ABNORMAL HIGH (ref <10)

## 2019-05-06 LAB — GLUCOSE, CAPILLARY: Glucose-Capillary: 180 mg/dL — ABNORMAL HIGH (ref 70–99)

## 2019-05-06 MED ORDER — CHLORDIAZEPOXIDE HCL 25 MG PO CAPS
25.0000 mg | ORAL_CAPSULE | Freq: Once | ORAL | Status: AC
  Administered 2019-05-06: 15:00:00 25 mg via ORAL
  Filled 2019-05-06: qty 1

## 2019-05-06 MED ORDER — SODIUM CHLORIDE 0.9 % IV BOLUS
1000.0000 mL | Freq: Once | INTRAVENOUS | Status: AC
  Administered 2019-05-06: 14:00:00 1000 mL via INTRAVENOUS

## 2019-05-06 MED ORDER — SODIUM CHLORIDE 0.9% FLUSH: 3.0000 mL | Freq: Once | INTRAVENOUS | Status: DC

## 2019-05-06 NOTE — ED Provider Notes (Signed)
[facility] Emergency Department Provider Note  ____________________________________________  Time seen: Approximately 2:18 PM  I have reviewed the triage vital signs and the nursing notes.   HISTORY  Chief Complaint Altered Mental Status    HPI Hal MoralesRyan Tiffany is a 43 y.o. male with a history of alcohol abuse, diabetes, hypertension, DKA who comes the ED complaining of malaise, brain fog  and generalized weakness for the past 2 weeks.  Gradual onset, no significant headache neck pain fevers chills.  No falls or trauma.  Symptoms have been waxing and waning, no aggravating or alleviating factors.  He does drink daily.  Prior to the onset of his symptoms, [place]  He went out to restaurants while on that vacation.     Past Medical History:  Diagnosis Date  . Alcohol abuse   . DKA (diabetic ketoacidoses) (HCC) 09/04/2018  . Morbid obesity (HCC)   . Patient denies medical problems   . Type 2 diabetes mellitus with hyperglycemia [facility](HCC)      Patient Active Problem List   Diagnosis Date Noted  . Anxiety 12/18/2018  . DM type 2, controlled, with complication (HCC) 09/19/2018  . Hyperlipidemia associated with type 2 diabetes mellitus (HCC) 09/19/2018  . HTN (hypertension) 09/19/2018  . Transaminitis 09/03/2018  . Hypokalemia 09/03/2018  . Alcohol abuse 09/03/2018     Past Surgical History:  Procedure Laterality Date  . ANKLE SURGERY       Prior to Admission medications   Medication Sig Start Date End Date Taking? Authorizing Provider  aspirin 81 MG EC tablet Take 1 tablet (81 mg total) by mouth daily. 09/19/18   Johnson, Megan P, DO  atorvastatin (LIPITOR) 10 MG tablet Take 1 tablet (10 mg total) by mouth daily. 04/27/19   Johnson, Megan P, DO  hydrOXYzine (ATARAX/VISTARIL) 25 MG tablet Take 1 tablet (25 mg total) by mouth 3 (three) times daily as needed. 04/27/19   Johnson, Megan P, DO  lisinopril (ZESTRIL)  5 MG tablet Take 1 tablet (5 mg total) by mouth daily. 04/27/19   Johnson, Megan P, DO  metFORMIN (GLUCOPHAGE-XR) 500 MG 24 hr tablet Take 2 tablets (1,000 mg total) by mouth 2 (two) times daily. 04/27/19   Johnson, Megan P, DO  metoprolol tartrate (LOPRESSOR) 25 MG tablet Take 0.5 tablets (12.5 mg total) by mouth 2 (two) times daily. 04/27/19   Olevia PerchesJohnson, Megan P, DO  Multiple Vitamin (MULTIVITAMIN WITH MINERALS) TABS tablet Take 1 tablet by mouth daily. 09/06/18   Enedina FinnerPatel, Sona, MD     Allergies Patient has no known allergies.   Family History  Problem Relation Age of Onset  . CAD Other   . Heart attack Mother   . CAD Father   . Diabetes Father     Social History Social History   Tobacco Use  . Smoking status: Former Smoker    Quit date: 09/20/2011    Years since quitting: 7.6  . Smokeless tobacco: Never Used  Substance Use Topics  . Alcohol use: Not Currently    Frequency: Never    Comment: Sober x 2 weeks as on 09/19/18  . Drug use: Not Currently    Review of Systems  Constitutional:   No fever or chills.  ENT:   No sore throat. No rhinorrhea. Cardiovascular:   No chest pain or syncope. Respiratory:   No dyspnea or cough. Gastrointestinal:   Negative for abdominal pain, vomiting and diarrhea.  Musculoskeletal:  Negative for focal pain or swelling All other systems reviewed and are negative except as documented above in ROS and HPI.  ____________________________________________   PHYSICAL EXAM:  VITAL SIGNS: ED Triage Vitals  Enc Vitals Group     BP 05/06/19 1244 127/60     Pulse Rate 05/06/19 1244 82     Resp 05/06/19 1240 18     Temp 05/06/19 1240 97.9 F (36.6 C)     Temp Source 05/06/19 1240 Oral     SpO2 05/06/19 1244 93 %     Weight 05/06/19 1240 300 lb (136.1 kg)     Height 05/06/19 1240 6' (1.829 m)     Head Circumference --      Peak Flow --      Pain Score 05/06/19 1245 0     Pain Loc --      Pain Edu? --      Excl. in GC? --     Vital signs  reviewed, nursing assessments reviewed.   Constitutional:   Alert and oriented. Non-toxic appearance. Eyes:   Conjunctivae are normal. EOMI. PERRL.  No nystagmus ENT      Head:   Normocephalic and atraumatic.      Nose:   No congestion/rhinnorhea.       Mouth/Throat:   Dry mucous membranes, no pharyngeal erythema. No peritonsillar mass.       Neck:   No meningismus. Full ROM. Hematological/Lymphatic/Immunilogical:   No cervical lymphadenopathy. Cardiovascular:   RRR. Symmetric bilateral radial and DP pulses.  No murmurs. Cap refill less than 2 seconds. Respiratory:   Normal respiratory effort without tachypnea/retractions. Breath sounds are clear and equal bilaterally. No wheezes/rales/rhonchi. Gastrointestinal:   Soft and nontender. Non distended. There is no CVA tenderness.  No rebound, rigidity, or guarding.  Musculoskeletal:   Normal range of motion in all extremities. No joint effusions.  No lower extremity tenderness.  No edema. Neurologic:   Normal speech and language.  Motor grossly intact. No acute focal neurologic deficits are appreciated.  NIH stroke scale 0 Skin:    Skin is warm, dry and intact. No rash noted.  No petechiae, purpura, or bullae.  ____________________________________________    LABS (pertinent positives/negatives) (all labs ordered are listed, but only abnormal results are displayed) Labs Reviewed  GLUCOSE, CAPILLARY - Abnormal; Notable for the following components:      Result Value   Glucose-Capillary 180 (*)    All other components within normal limits  COMPREHENSIVE METABOLIC PANEL - Abnormal; Notable for the following components:   CO2 17 (*)    Glucose, Bld 156 (*)    Calcium 8.8 (*)    Total Protein 8.3 (*)    AST 135 (*)    ALT 82 (*)    Anion gap 20 (*)    All other components within normal limits  CBC - Abnormal; Notable for the following components:   Platelets 99 (*)    All other components within normal limits  ETHANOL - Abnormal;  Notable for the following components:   Alcohol, Ethyl (B) 212 (*)    All other components within normal limits  NOVEL CORONAVIRUS, NAA (HOSPITAL ORDER, SEND-OUT TO REF LAB)  CBG MONITORING, ED   ____________________________________________   EKG    ____________________________________________    RADIOLOGY  Ct Head Wo Contrast  Result Date: 05/06/2019 CLINICAL DATA:  Double vision 4-5 days with confusion over the past few weeks. Trouble swallowing recently. EXAM: CT HEAD WITHOUT CONTRAST TECHNIQUE: Contiguous axial images were  obtained from the base of the skull through the vertex without intravenous contrast. COMPARISON:  None. FINDINGS: Brain: Ventricles, cisterns and other CSF spaces are normal. There is no mass, mass effect, shift of midline structures or acute hemorrhage. No evidence of acute infarction. Vascular: No hyperdense vessel or unexpected calcification. Skull: Normal. Negative for fracture or focal lesion. Sinuses/Orbits: No acute finding. Other: None. IMPRESSION: Normal head CT. Electronically Signed   By: Marin Olp M.D.   On: 05/06/2019 13:58   Dg Chest Portable 1 View  Result Date: 05/06/2019 CLINICAL DATA:  Weakness. Dysphagia. Alcohol abuse. EXAM: PORTABLE CHEST 1 VIEW COMPARISON:  None. FINDINGS: Mildly enlarged cardiac silhouette and mildly prominent pulmonary vasculature. Clear lungs. Thoracic spine degenerative changes. IMPRESSION: Mild cardiomegaly and mild pulmonary vascular congestion. Electronically Signed   By: Claudie Revering M.D.   On: 05/06/2019 13:48    ____________________________________________   PROCEDURES Procedures  ____________________________________________  DIFFERENTIAL DIAGNOSIS   Intracranial hemorrhage, alcohol intoxication, electrolyte abnormality, dehydration, COVID-19  CLINICAL IMPRESSION / ASSESSMENT AND PLAN / ED COURSE  Medications ordered in the ED: Medications  sodium chloride flush (NS) 0.9 % injection 3 mL (3 mLs  Intravenous Not Given 05/06/19 1331)  sodium chloride 0.9 % bolus 1,000 mL (1,000 mLs Intravenous New Bag/Given 05/06/19 1400)    Pertinent labs & imaging results that were available during my care of the patient were reviewed by me and considered in my medical decision making (see chart for details).  Zymire Turnbo was evaluated in Emergency Department on 05/06/2019 for the symptoms described in the history of present illness. He was evaluated in the context of the global COVID-19 pandemic, which necessitated consideration that the patient might be at risk for infection with the SARS-CoV-2 virus that causes COVID-19. Institutional protocols and algorithms that pertain to the evaluation of patients at risk for COVID-19 are in a state of rapid change based on information released by regulatory bodies including the CDC and federal and state organizations. These policies and algorithms were followed during the patient's care in the ED.   Patient presents with vague symptoms of malaise generalized weakness and decreased mental acuity subjectively.  Doubt meningitis encephalitis stroke vascular occlusion or dissection or subarachnoid hemorrhage.  No signs of any cardiopulmonary pathology.  Vital signs are unremarkable.  Clinical Course as of May 05 1417  Sun May 06, 2019  1328 Patient presents with a two-week history of brain fog, generalized weakness and malaise in the setting of daily alcohol abuse as well as a trip to a [employer] outings 3 weeks ago.  amid the current coronavirus pandemic [place] is noted to stay with particularly severe outbreak due to lacks public health considerations.  I will check CT head, chest x-ray.  Will need to send a coronavirus swab today.  He reports that he had 1 done 3 days ago and has not had the result yet, but also describes a poor collection technique.  The result is not available in our computer system.  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   [PS]  0254 CT head and chest x-ray are unremarkable.  He has a mild metabolic acidosis which I think is due to starvation and alcohol abuse.  Will attempt to feed him in addition to IV hydration.   [PS]    Clinical Course User Index [PS] Carrie Mew, MD    -----------------------------------------  3:16 PM on 05/06/2019 -----------------------------------------  UA unremarkable except for mild to moderate ketones.  Patient is eating, not hyperglycemic, stable for discharge.  Vital signs unremarkable.  He plans to go to [place] after his COVID test comes back if it is negative.  Dose of Librium for now for withdrawal prevention.   ____________________________________________   FINAL CLINICAL IMPRESSION(S) / ED DIAGNOSES    Final diagnoses:  Alcohol abuse  Dehydration     ED Discharge Orders    None      Portions of this note were generated with dragon dictation software. Dictation errors may occur despite best attempts at proofreading.   Sharman CheekStafford, Deondria Puryear, MD 05/06/19 1517

## 2019-05-06 NOTE — ED Triage Notes (Addendum)
Pt arrived via POV with reports of double vision x 4-5 days, confusion for the past few weeks, trouble swallowing for the past week.  Pt states he came in today because he thinks there is something wrong.  Pt takes medication for anxiety-hydroxyzine.   Pt is alert and oriented on arrival, no slurred speech, grip strengths equal bilaterally, no facial droop.   Pt states he has had diarrhea as well.  Pt is an alcoholic as well.  Pt states he last drank alcohol last night, pt states he drinks 1/5 of liquor daily.

## 2019-05-06 NOTE — ED Notes (Addendum)
Pt states for the past couple weeks he just doesn't feel right- has had problems with his memory- has had double vision and weakness

## 2019-05-06 NOTE — ED Notes (Signed)
EDP at bedside  

## 2019-05-06 NOTE — Discharge Instructions (Addendum)
Your lab tests today showed an elevated alcohol level and signs of dehydration.  Your CT scan of the head and chest xray were okay. We sent a Coronavirus test today that should come back in about 2 days. In the meantime, you should quarantine at home until the result is available.  If it is positive, you will need to quarantine at home for up to 2 more weeks.

## 2019-05-06 NOTE — ED Notes (Signed)
Pt given graham crackers and water for PO challenge/

## 2019-05-06 NOTE — ED Notes (Signed)
Pad in room not working- printed discharge and had pt sign

## 2019-05-08 ENCOUNTER — Encounter: Payer: Self-pay | Admitting: Family Medicine

## 2019-05-08 ENCOUNTER — Ambulatory Visit: Payer: Self-pay | Admitting: Family Medicine

## 2019-05-09 ENCOUNTER — Telehealth: Payer: Self-pay | Admitting: Family Medicine

## 2019-05-09 LAB — NOVEL CORONAVIRUS, NAA (HOSP ORDER, SEND-OUT TO REF LAB; TAT 18-24 HRS): SARS-CoV-2, NAA: NOT DETECTED

## 2019-05-09 LAB — NOVEL CORONAVIRUS, NAA: SARS-CoV-2, NAA: DETECTED — AB

## 2019-05-09 NOTE — Telephone Encounter (Signed)
Noted  

## 2019-05-09 NOTE — Telephone Encounter (Signed)
[facility] nurse called in to let us know that pt tested positive for Covid-19.

## 2019-05-10 ENCOUNTER — Telehealth: Payer: Self-pay | Admitting: Emergency Medicine

## 2019-05-10 NOTE — Telephone Encounter (Signed)
Called to inform of covid test results.  Patient had a positive one and then latest is neagavie.  No answer andno voicemial.

## 2019-05-11 ENCOUNTER — Ambulatory Visit (INDEPENDENT_AMBULATORY_CARE_PROVIDER_SITE_OTHER): Payer: Self-pay | Admitting: Family Medicine

## 2019-05-11 ENCOUNTER — Encounter: Payer: Self-pay | Admitting: Family Medicine

## 2019-05-11 ENCOUNTER — Other Ambulatory Visit: Payer: Self-pay

## 2019-05-11 ENCOUNTER — Ambulatory Visit: Payer: Self-pay | Admitting: Licensed Clinical Social Worker

## 2019-05-11 VITALS — BP 146/85

## 2019-05-11 DIAGNOSIS — U071 COVID-19: Secondary | ICD-10-CM

## 2019-05-11 DIAGNOSIS — F1010 Alcohol abuse, uncomplicated: Secondary | ICD-10-CM

## 2019-05-11 DIAGNOSIS — J988 Other specified respiratory disorders: Secondary | ICD-10-CM

## 2019-05-11 HISTORY — DX: COVID-19: U07.1

## 2019-05-11 NOTE — Chronic Care Management (AMB) (Signed)
  Care Management Note   Cory Snyder is a 43 y.o. year old male who is a primary care patient of Cory Roys, DO . The CM team was consulted for assistance with Cory Snyder -substance abuse resources.   Review of patient status, including review of consultants reports, rand collaboration with appropriate care team members and the patient's provider was performed as part of comprehensive patient evaluation and provision of care management services. Telephone outreach to patient today to introduce CM services.   LCSW completed call to [facility] and spoke with staff. [facility] is agreeable to consider patient for substance abuse treatment- Detox or Rehabilitation. However, [facility] has to speak with patient personally in order to complete assessment. [facility] ask that patient contact them and they would be happy to assess his needs. I reached out to [name] by phone today but was unable to reach him successfully. A HIPPA compliant voice message was left encouraging patient to return call once available to LCSW and [facility]. Contact numbers provided. LCSW rescheduled CCM SW appointment as well.  Follow Up Plan: SW will follow up with patient by phone over the next 30 days  [name], [name], [name], [name] Phone: [phone]

## 2019-05-11 NOTE — Progress Notes (Signed)
BP (!) 146/85 Comment: pt reported   Subjective:    Patient ID: Cory Snyder Pickford, male    DOB: [birth year], 43 y.o.   MRN: [id number]  HPI: Cory Snyder Schloss is a [age] y.o. male  Chief Complaint  Patient presents with  . Follow-up    pt states he had 2 tests for COVID, first positive and second negative. Wants to discuss this  . Alcohol Problem    pt states he has started back drinking a fifth per day, wants to talk about detox options   Alycia RossettiRyan presents today via telemedicine visit. He is not doing very well. He notes that he tested + for COVID 1 week ago- had another test 5 days ago that was negative. He notes that he is feeling OK. He has not been coughing. He has been having issues with getting his thoughts together. He went to the ER a couple of days ago, because he was having trouble with his thoughts. They thought that this was in part due to his anxiety. He has not been doing well with quarantine. He has been drinking more. He is concerned about his drinking and notes that he has to stop or it's going to kill him. He notes that he has been drinking about a 1/5 of liquor daily again. He has gone through detox before and had seizures with it. He notes that he has support at home, but is very anxious about being able to detox.   Relevant past medical, surgical, family and social history reviewed and updated as indicated. Interim medical history since our last visit reviewed. Allergies and medications reviewed and updated.  Review of Systems  Constitutional: Positive for fatigue. Negative for activity change, appetite change, chills, diaphoresis, fever and unexpected weight change.  Respiratory: Negative.   Cardiovascular: Negative.   Skin: Negative.   Psychiatric/Behavioral: Positive for confusion and decreased concentration. Negative for agitation, behavioral problems, dysphoric mood, hallucinations, self-injury, sleep disturbance and suicidal ideas. The patient is not nervous/anxious and is not  hyperactive.     Per HPI unless specifically indicated above     Objective:    BP (!) 146/85 Comment: pt reported  Wt Readings from Last 3 Encounters:  05/06/19 300 lb (136.1 kg)  12/18/18 (!) 329 lb 14.4 oz (149.6 kg)  09/19/18 (!) 319 lb (144.7 kg)    Physical Exam Vitals signs and nursing note reviewed.  Constitutional:      General: He is not in acute distress.    Appearance: Normal appearance. He is not ill-appearing, toxic-appearing or diaphoretic.  HENT:     Head: Normocephalic and atraumatic.     Right Ear: External ear normal.     Left Ear: External ear normal.     Nose: Nose normal.     Mouth/Throat:     Mouth: Mucous membranes are moist.     Pharynx: Oropharynx is clear.  Eyes:     General: No scleral icterus.       Right eye: No discharge.        Left eye: No discharge.     Conjunctiva/sclera: Conjunctivae normal.     Pupils: Pupils are equal, round, and reactive to light.  Neck:     Musculoskeletal: Normal range of motion.  Pulmonary:     Effort: Pulmonary effort is normal. No respiratory distress.     Comments: Speaking in full sentences Musculoskeletal: Normal range of motion.  Skin:    Coloration: Skin is not jaundiced or pale.  Findings: No bruising, erythema, lesion or rash.  Neurological:     Mental Status: He is alert and oriented to person, place, and time. Mental status is at baseline.  Psychiatric:        Mood and Affect: Mood normal.        Behavior: Behavior normal.        Thought Content: Thought content normal.        Judgment: Judgment normal.     Results for orders placed or performed during the hospital encounter of 05/06/19  Novel Coronavirus,NAA,(SEND-OUT TO REF LAB - TAT 24-48 hrs); Hosp Order   Specimen: Nasopharyngeal Swab; Respiratory  Result Value Ref Range   SARS-CoV-2, NAA NOT DETECTED NOT DETECTED   Coronavirus Source NASOPHARYNGEAL   Glucose, capillary  Result Value Ref Range   Glucose-Capillary 180 (H) 70 - 99  mg/dL  Comprehensive metabolic panel  Result Value Ref Range   Sodium 138 135 - 145 mmol/L   Potassium 3.5 3.5 - 5.1 mmol/L   Chloride 101 98 - 111 mmol/L   CO2 17 (L) 22 - 32 mmol/L   Glucose, Bld 156 (H) 70 - 99 mg/dL   BUN 9 6 - 20 mg/dL   Creatinine, Ser 0.61 0.61 - 1.24 mg/dL   Calcium 8.8 (L) 8.9 - 10.3 mg/dL   Total Protein 8.3 (H) 6.5 - 8.1 g/dL   Albumin 4.4 3.5 - 5.0 g/dL   AST 135 (H) 15 - 41 U/L   ALT 82 (H) 0 - 44 U/L   Alkaline Phosphatase 52 38 - 126 U/L   Total Bilirubin 1.0 0.3 - 1.2 mg/dL   GFR calc non Af Amer >60 >60 mL/min   GFR calc Af Amer >60 >60 mL/min   Anion gap 20 (H) 5 - 15  CBC  Result Value Ref Range   WBC 7.3 4.0 - 10.5 K/uL   RBC 5.25 4.22 - 5.81 MIL/uL   Hemoglobin 15.6 13.0 - 17.0 g/dL   HCT 46.7 39.0 - 52.0 %   MCV 89.0 80.0 - 100.0 fL   MCH 29.7 26.0 - 34.0 pg   MCHC 33.4 30.0 - 36.0 g/dL   RDW 14.8 11.5 - 15.5 %   Platelets 99 (L) 150 - 400 K/uL   nRBC 0.0 0.0 - 0.2 %  Ethanol  Result Value Ref Range   Alcohol, Ethyl (B) 212 (H) <10 mg/dL  Urinalysis, Complete w Microscopic  Result Value Ref Range   Color, Urine AMBER (A) YELLOW   APPearance HAZY (A) CLEAR   Specific Gravity, Urine 1.028 1.005 - 1.030   pH 5.0 5.0 - 8.0   Glucose, UA NEGATIVE NEGATIVE mg/dL   Hgb urine dipstick NEGATIVE NEGATIVE   Bilirubin Urine NEGATIVE NEGATIVE   Ketones, ur 20 (A) NEGATIVE mg/dL   Protein, ur 100 (A) NEGATIVE mg/dL   Nitrite NEGATIVE NEGATIVE   Leukocytes,Ua NEGATIVE NEGATIVE   RBC / HPF 0-5 0 - 5 RBC/hpf   WBC, UA 0-5 0 - 5 WBC/hpf   Bacteria, UA RARE (A) NONE SEEN   Squamous Epithelial / LPF 0-5 0 - 5   Mucus PRESENT    Hyaline Casts, UA PRESENT       Assessment & Plan:   Problem List Items Addressed This Visit      Other   Alcohol abuse    Discussed that given his history of previous seizures with detox, it is not safe for him to detox at home. Will try to get CCM team  involved to see if there is a detox facility that he can go  to with +COVID. On discussion with Dickie LaBrooke Joyce, LCSW- we found that [facility] will take him if he is asymptomatic, but that he will need to self-refer. He has their number- will call to get in to see them.        Relevant Orders   Referral to Chronic Care Management Services   COVID-19 - Primary    Discussed that given his + we are assuming that he has covid. Needs to quarantine for 2 weeks, 1 week without symptoms and 3 days without fevers. Continue to monitor. Currently asymptomatic at this time.       Relevant Orders   Referral to Chronic Care Management Services       Follow up plan: Return for After detox.   . This visit was completed via [employer] due to the restrictions of the COVID-19 pandemic. All issues as above were discussed and addressed. Physical exam was done as above through visual confirmation on [employer]. If it was felt that the patient should be evaluated in the office, they were directed there. The patient verbally consented to this visit. . Location of the patient: home . Location of the provider: work . Those involved with this call:  . Provider: Olevia PerchesMegan Tomasina Keasling, DO . CMA: Wilhemena DurieBrittany Russell, CMA . Front Desk/Registration: Adela Portshristan Williamson  . Time spent on call: 25 minutes with patient face to face via video conference. More than 50% of this time was spent in counseling and coordination of care. 40 minutes total spent in review of patient's record and preparation of their chart.

## 2019-05-11 NOTE — Assessment & Plan Note (Signed)
Discussed that given his history of previous seizures with detox, it is not safe for him to detox at home. Will try to get CCM team involved to see if there is a detox facility that he can go to with +COVID. On discussion with [name], LCSW- we found that [facility] will take him if he is asymptomatic, but that he will need to self-refer. He has their number- will call to get in to see them.

## 2019-05-11 NOTE — Assessment & Plan Note (Addendum)
Discussed that given his + we are assuming that he has covid. Needs to quarantine for 2 weeks, 1 week without symptoms and 3 days without fevers. Continue to monitor. Currently asymptomatic at this time.

## 2019-05-18 ENCOUNTER — Telehealth: Payer: Self-pay | Admitting: Family Medicine

## 2019-05-18 NOTE — Telephone Encounter (Signed)
Copied from [place] [id number]. Topic: General - Other >> May 18, 2019 10:45 AM Leward Quan A wrote: Reason for CRM: Patient girlfriend Megan Mans called to say patient is in the process of quitting his alcoholism and know that he need some help. On his own patient have been trying to get into a treatment facility but to no avail. So now he would like to know if Dr Wynetta Emery can possibly help him with a medication or some other means. Please call patient at Ph# [phone] Ander Purpura Ph# [phone]

## 2019-05-18 NOTE — Telephone Encounter (Signed)
Called and left a detailed message for patient.  

## 2019-05-18 NOTE — Telephone Encounter (Signed)
Unfortunately, like I told him last week, I cannot treat him for alcohol withdrawal at home. They can call [facility]- as discussed last week (see LCSW note from 7/24) or they can take him to the hospital for detox.

## 2019-06-05 ENCOUNTER — Ambulatory Visit: Payer: Self-pay | Admitting: Licensed Clinical Social Worker

## 2019-06-05 NOTE — Chronic Care Management (AMB) (Signed)
  Chronic Care Management    Clinical Social Work Follow Up Note  06/05/2019 Name: Cory Snyder MRN: [id number] DOB: Dec 10, 1975  Cory Snyder is a 43 y.o. year old male who is a primary care patient of Cory Roys, DO. The CCM team was consulted for assistance with [name] .   Review of patient status, including review of consultants reports, other relevant assessments, and collaboration with appropriate care team members and the patient's provider was performed as part of comprehensive patient evaluation and provision of chronic care management services.     LCSW completed CCM outreach attempt today but was unable to reach patient successfully. A HIPPA compliant voice message was left encouraging patient to return call once available. LCSW rescheduled CCM SW appointment as well.  Follow Up Plan: SW will follow up with patient by phone over the next month  Cory Snyder, [place], [name], [email or web address] Phone: [phone]

## 2019-06-08 ENCOUNTER — Telehealth: Payer: Self-pay

## 2019-06-08 ENCOUNTER — Ambulatory Visit: Payer: Self-pay | Admitting: Licensed Clinical Social Worker

## 2019-06-08 NOTE — Chronic Care Management (AMB) (Signed)
  Care Management   Follow Up Note   06/08/2019 Name: Logun Colavito MRN: [id number] DOB: [birth year]  Referred by: Valerie Roys, DO Reason for referral : Care Coordination   Rosevelt Luu is a 43 y.o. year old male who is a primary care patient of Valerie Roys, DO. The care management team was consulted for assistance with care management and care coordination needs.    Review of patient status, including review of consultants reports, relevant laboratory and other test results, and collaboration with appropriate care team members and the patient's provider was performed as part of comprehensive patient evaluation and provision of chronic care management services.    LCSW completed CCM outreach attempt today but was unable to reach patient successfully. A HIPPA compliant voice message was left encouraging patient to return call once available. LCSW rescheduled CCM SW appointment as well.  A HIPPA compliant phone message was left for the patient providing contact information and requesting a return call.   [name], BSW, [name], [facility] Care Management [email or web address] Phone: [phone]

## 2019-06-18 ENCOUNTER — Ambulatory Visit (INDEPENDENT_AMBULATORY_CARE_PROVIDER_SITE_OTHER): Payer: Self-pay | Admitting: Family Medicine

## 2019-06-18 ENCOUNTER — Encounter: Payer: Self-pay | Admitting: Family Medicine

## 2019-06-18 ENCOUNTER — Other Ambulatory Visit: Payer: Self-pay

## 2019-06-18 DIAGNOSIS — E118 Type 2 diabetes mellitus with unspecified complications: Secondary | ICD-10-CM

## 2019-06-18 DIAGNOSIS — F1021 Alcohol dependence, in remission: Secondary | ICD-10-CM

## 2019-06-18 LAB — BAYER DCA HB A1C WAIVED: HB A1C (BAYER DCA - WAIVED): 6.1 % (ref <7.0)

## 2019-06-18 MED ORDER — NALTREXONE HCL 50 MG PO TABS: 50.0000 mg | ORAL_TABLET | Freq: Every day | ORAL | 6 refills | Status: DC

## 2019-06-18 NOTE — Assessment & Plan Note (Signed)
30 days without a drink. Going to meetings. Going to hook up with [facility]. Feeling well. Will given Rx for naltrexone in case the cravings start up. Continue to monitor. Call with any concerns.

## 2019-06-18 NOTE — Assessment & Plan Note (Addendum)
Tolerating the medicine well. A1c under good control today at A1c of 6.1. Continue current regimen. Continue to monitor. Call with any concerns.

## 2019-06-18 NOTE — Progress Notes (Signed)
BP 117/77   Pulse (!) 56   Temp 98.5 F (36.9 C) (Oral)   Ht 6' (1.829 m)   Wt (!) 319 lb (144.7 kg)   SpO2 94%   BMI 43.26 kg/m    Subjective:    Patient ID: Cory Snyder, male    DOB: 1976/05/25, 43 y.o.   MRN: [id number]  HPI: Cory Snyder is a 43 y.o. male  Chief Complaint  Patient presents with  . Alcohol Intoxication    64m f/u   Doing well with his drinking. Went through detox. Has a new job. Feeling well. No drinking in 30 days. Going to meetings. Very optimistic.  GAD 7 : Generalized Anxiety Score 06/18/2019 01/17/2019 12/18/2018 09/19/2018  Nervous, Anxious, on Edge 1 1 3 2   Control/stop worrying 1 0 3 2  Worry too much - different things 1 1 3 2   Trouble relaxing 1 0 3 2  Restless 1 0 2 1  Easily annoyed or irritable 1 0 2 1  Afraid - awful might happen 1 0 2 1  Total GAD 7 Score 7 2 18 11   Anxiety Difficulty Not difficult at all Not difficult at all Very difficult Somewhat difficult   DIABETES Hypoglycemic episodes:no Polydipsia/polyuria: no Visual disturbance: no Chest pain: no Paresthesias: no Glucose Monitoring: no  Accucheck frequency: Not Checking Taking Insulin?: no Blood Pressure Monitoring: not checking Retinal Examination: Not Up to Date Foot Exam: Up to Date Diabetic Education: Completed Pneumovax: Up to Date Influenza: Declined Aspirin: no  Relevant past medical, surgical, family and social history reviewed and updated as indicated. Interim medical history since our last visit reviewed. Allergies and medications reviewed and updated.  Review of Systems  Constitutional: Negative.   Respiratory: Negative.   Cardiovascular: Negative.   Musculoskeletal: Negative.   Psychiatric/Behavioral: Negative.     Per HPI unless specifically indicated above     Objective:    BP 117/77   Pulse (!) 56   Temp 98.5 F (36.9 C) (Oral)   Ht 6' (1.829 m)   Wt (!) 319 lb (144.7 kg)   SpO2 94%   BMI 43.26 kg/m   Wt Readings from Last 3  Encounters:  06/18/19 (!) 319 lb (144.7 kg)  05/06/19 300 lb (136.1 kg)  12/18/18 (!) 329 lb 14.4 oz (149.6 kg)    Physical Exam Vitals signs and nursing note reviewed.  Constitutional:      General: He is not in acute distress.    Appearance: Normal appearance. He is not ill-appearing, toxic-appearing or diaphoretic.  HENT:     Head: Normocephalic and atraumatic.     Right Ear: External ear normal.     Left Ear: External ear normal.     Nose: Nose normal.     Mouth/Throat:     Mouth: Mucous membranes are moist.     Pharynx: Oropharynx is clear.  Eyes:     General: No scleral icterus.       Right eye: No discharge.        Left eye: No discharge.     Extraocular Movements: Extraocular movements intact.     Conjunctiva/sclera: Conjunctivae normal.     Pupils: Pupils are equal, round, and reactive to light.  Neck:     Musculoskeletal: Normal range of motion and neck supple.  Cardiovascular:     Rate and Rhythm: Normal rate and regular rhythm.     Pulses: Normal pulses.     Heart sounds: Normal heart sounds. No murmur. No  friction rub. No gallop.   Pulmonary:     Effort: Pulmonary effort is normal. No respiratory distress.     Breath sounds: Normal breath sounds. No stridor. No wheezing, rhonchi or rales.  Chest:     Chest wall: No tenderness.  Musculoskeletal: Normal range of motion.  Skin:    General: Skin is warm and dry.     Capillary Refill: Capillary refill takes less than 2 seconds.     Coloration: Skin is not jaundiced or pale.     Findings: No bruising, erythema, lesion or rash.  Neurological:     General: No focal deficit present.     Mental Status: He is alert and oriented to person, place, and time. Mental status is at baseline.  Psychiatric:        Mood and Affect: Mood normal.        Behavior: Behavior normal.        Thought Content: Thought content normal.        Judgment: Judgment normal.     Results for orders placed or performed during the hospital  encounter of 05/06/19  Novel Coronavirus,NAA,(SEND-OUT TO REF LAB - TAT 24-48 hrs); Hosp Order   Specimen: Nasopharyngeal Swab; Respiratory  Result Value Ref Range   SARS-CoV-2, NAA NOT DETECTED NOT DETECTED   Coronavirus Source NASOPHARYNGEAL   Glucose, capillary  Result Value Ref Range   Glucose-Capillary 180 (H) 70 - 99 mg/dL  Comprehensive metabolic panel  Result Value Ref Range   Sodium 138 135 - 145 mmol/L   Potassium 3.5 3.5 - 5.1 mmol/L   Chloride 101 98 - 111 mmol/L   CO2 17 (L) 22 - 32 mmol/L   Glucose, Bld 156 (H) 70 - 99 mg/dL   BUN 9 6 - 20 mg/dL   Creatinine, Ser 9.140.61 0.61 - 1.24 mg/dL   Calcium 8.8 (L) 8.9 - 10.3 mg/dL   Total Protein 8.3 (H) 6.5 - 8.1 g/dL   Albumin 4.4 3.5 - 5.0 g/dL   AST 782135 (H) 15 - 41 U/L   ALT 82 (H) 0 - 44 U/L   Alkaline Phosphatase 52 38 - 126 U/L   Total Bilirubin 1.0 0.3 - 1.2 mg/dL   GFR calc non Af Amer >60 >60 mL/min   GFR calc Af Amer >60 >60 mL/min   Anion gap 20 (H) 5 - 15  CBC  Result Value Ref Range   WBC 7.3 4.0 - 10.5 K/uL   RBC 5.25 4.22 - 5.81 MIL/uL   Hemoglobin 15.6 13.0 - 17.0 g/dL   HCT 95.646.7 21.339.0 - 08.652.0 %   MCV 89.0 80.0 - 100.0 fL   MCH 29.7 26.0 - 34.0 pg   MCHC 33.4 30.0 - 36.0 g/dL   RDW 57.814.8 46.911.5 - 62.915.5 %   Platelets 99 (L) 150 - 400 K/uL   nRBC 0.0 0.0 - 0.2 %  Ethanol  Result Value Ref Range   Alcohol, Ethyl (B) 212 (H) <10 mg/dL  Urinalysis, Complete w Microscopic  Result Value Ref Range   Color, Urine AMBER (A) YELLOW   APPearance HAZY (A) CLEAR   Specific Gravity, Urine 1.028 1.005 - 1.030   pH 5.0 5.0 - 8.0   Glucose, UA NEGATIVE NEGATIVE mg/dL   Hgb urine dipstick NEGATIVE NEGATIVE   Bilirubin Urine NEGATIVE NEGATIVE   Ketones, ur 20 (A) NEGATIVE mg/dL   Protein, ur 528100 (A) NEGATIVE mg/dL   Nitrite NEGATIVE NEGATIVE   Leukocytes,Ua NEGATIVE NEGATIVE   RBC / HPF  0-5 0 - 5 RBC/hpf   WBC, UA 0-5 0 - 5 WBC/hpf   Bacteria, UA RARE (A) NONE SEEN   Squamous Epithelial / LPF 0-5 0 - 5   Mucus  PRESENT    Hyaline Casts, UA PRESENT       Assessment & Plan:   Problem List Items Addressed This Visit      Endocrine   DM type 2, controlled, with complication (HCC)    Tolerating the medicine well. A1c under good control today at A1c of 6.1. Continue current regimen. Continue to monitor. Call with any concerns.         Other   Moderate alcohol use disorder, in early remission (HCC)    30 days without a drink. Going to meetings. Going to hook up with [facility]. Feeling well. Will given Rx for naltrexone in case the cravings start up. Continue to monitor. Call with any concerns.           Follow up plan: Return in about 3 months (around 09/17/2019) for Physical.

## 2019-07-20 ENCOUNTER — Ambulatory Visit: Payer: Self-pay | Admitting: Licensed Clinical Social Worker

## 2019-07-20 ENCOUNTER — Telehealth: Payer: Self-pay

## 2019-07-20 NOTE — Chronic Care Management (AMB) (Addendum)
  Care Management   Follow Up Note   07/20/2019 Name: Cory Snyder MRN: [id number] DOB: [birth year]  Referred by: Valerie Roys, DO Reason for referral : Care Coordination   Cory Snyder is a 43 y.o. year old male who is a primary care patient of Valerie Roys, DO. The care management team was consulted for assistance with care management and care coordination needs.    Review of patient status, including review of consultants reports, relevant laboratory and other test results, and collaboration with appropriate care team members and the patient's provider was performed as part of comprehensive patient evaluation and provision of chronic care management services.    Patient sent secure text message to LCSW stating that things are going really well and that he just hit his 60 days without any alcohol use. LCSW provided positive reinforcement for this amazing achievement. Patient is attending [place] meetings on his own. Patient is agreeable to contact CCM LCSW directly in case he wishes to talk, needs resource connection, emotional support or education in the future. Patient agreeable to this plan and denies needing assistance at this given time.  The patient has been provided with contact information for the care management team and has been advised to call with any health related questions or concerns.   [name], BSW, [name], [facility] Care Management [email or web address] Phone: [phone]

## 2019-07-20 NOTE — Chronic Care Management (AMB) (Signed)
  Care Management   Follow Up Note   07/20/2019 Name: Cory Snyder MRN: [id number] DOB: May 24, 1976  Referred by: Valerie Roys, DO Reason for referral : Care Coordination   Cory Snyder is a 43 y.o. year old male who is a primary care patient of Valerie Roys, DO. The care management team was consulted for assistance with care management and care coordination needs.    Review of patient status, including review of consultants reports, relevant laboratory and other test results, and collaboration with appropriate care team members and the patient's provider was performed as part of comprehensive patient evaluation and provision of chronic care management services.    LCSW completed CCM outreach attempt today but was unable to reach patient successfully. A HIPPA compliant voice message was left encouraging patient to return call once available. LCSW rescheduled CCM SW appointment as well.  The care management team will reach out to the patient again over the next 30 days days.   [name], BSW, [name], [facility] Care Management [email or web address] Phone: [phone]

## 2019-09-05 ENCOUNTER — Telehealth: Payer: Self-pay

## 2020-02-22 IMAGING — CT CT HEAD WITHOUT CONTRAST
3 of 4 series · 16 of 47 positions shown, 19 images · non-contrast
Comparison: None.

CLINICAL DATA: Double vision 4-5 days with confusion over the past
few weeks. Trouble swallowing recently.

EXAM:
CT HEAD WITHOUT CONTRAST
TECHNIQUE: Contiguous axial images were obtained from the base of the skull
through the vertex without intravenous contrast.

[Series 3: head wo · axial · 0.44mm/px · z∈[+360,+490]mm · 10 of 32 slices shown, 13 images]
[im 3/32  brain]
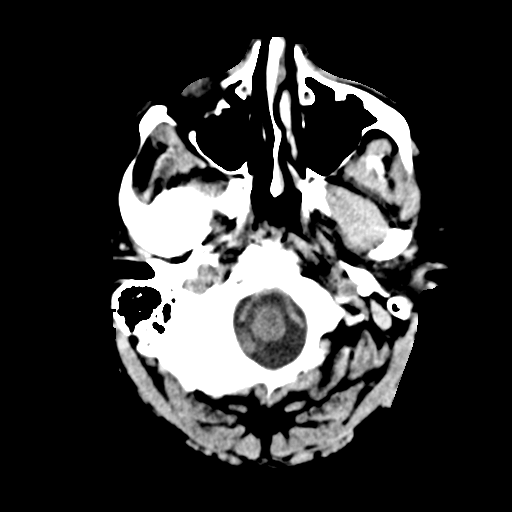
[im 3/32  bone]
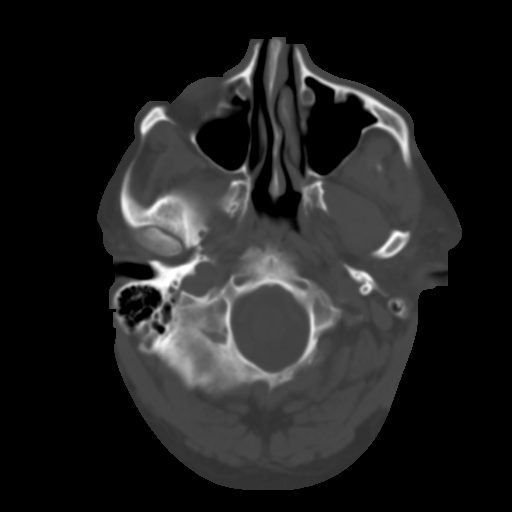
[im 5/32  brain]
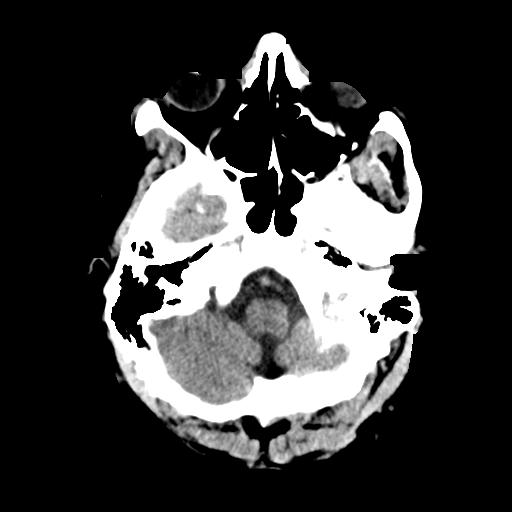
[im 9/32  brain]
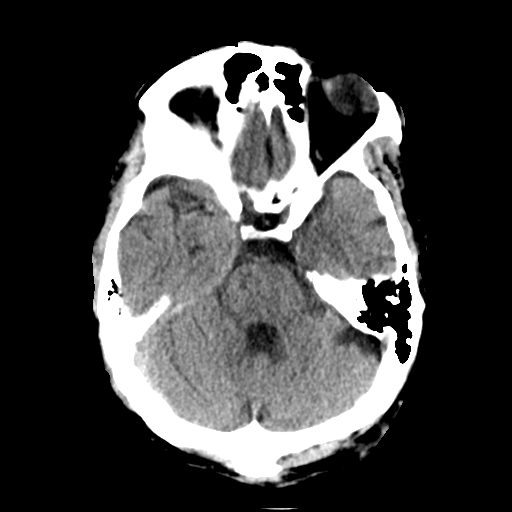
[im 12/32  brain]
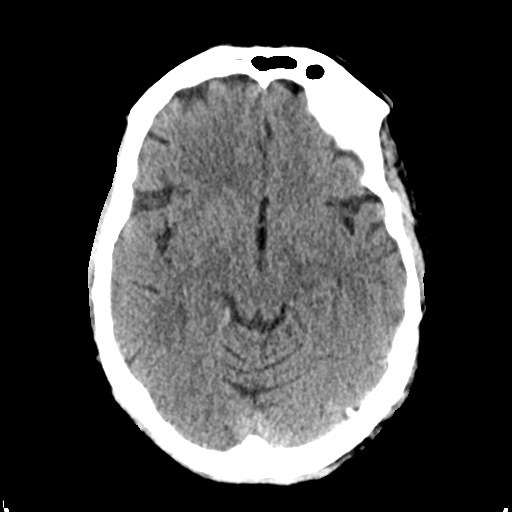
[im 14/32  brain]
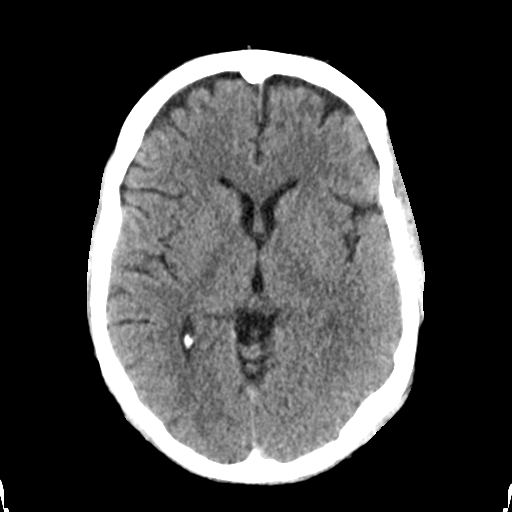
[im 14/32  bone]
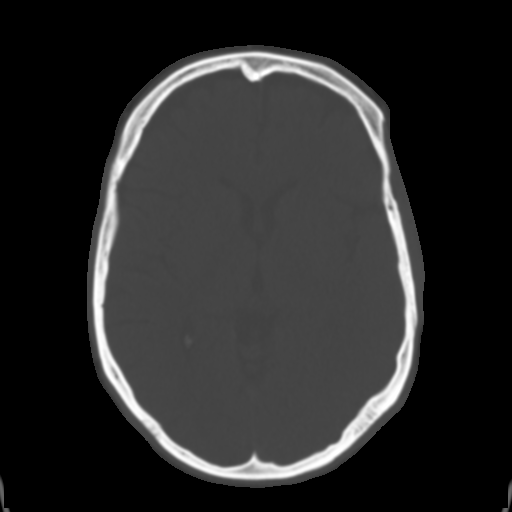
[im 18/32  brain]
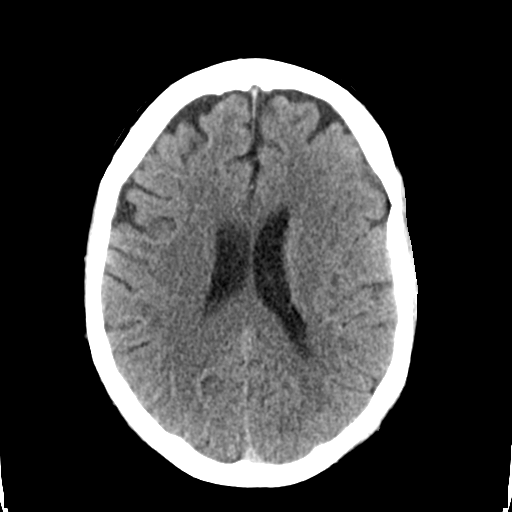
[im 20/32  brain]
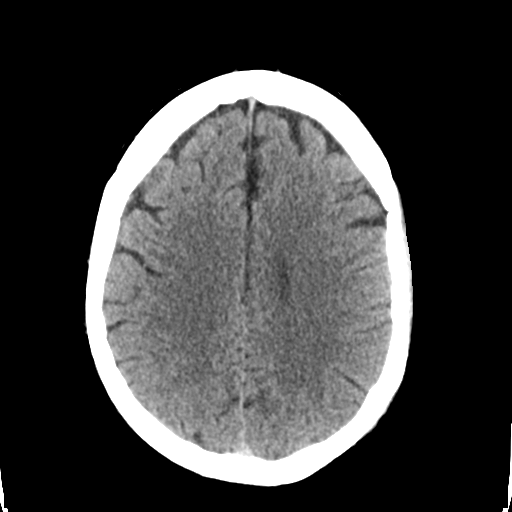
[im 23/32  brain]
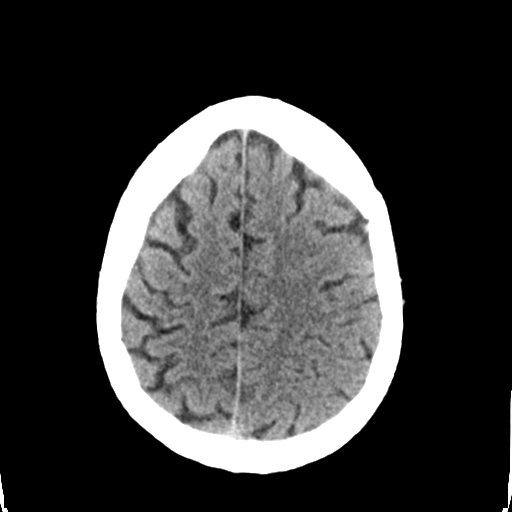
[im 27/32  brain]
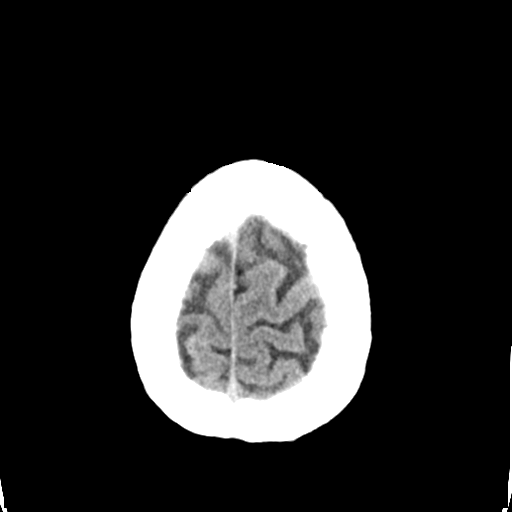
[im 27/32  bone]
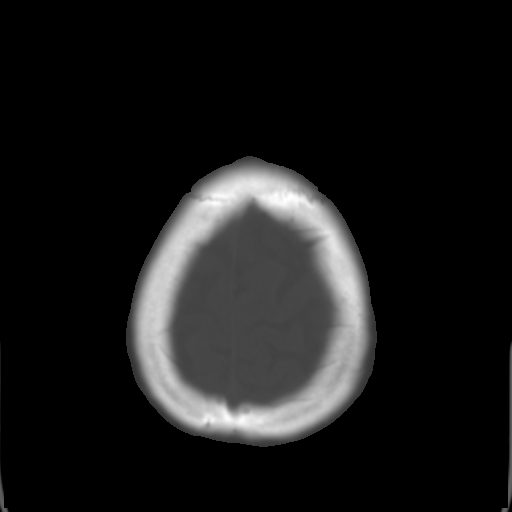
[im 29/32  brain]
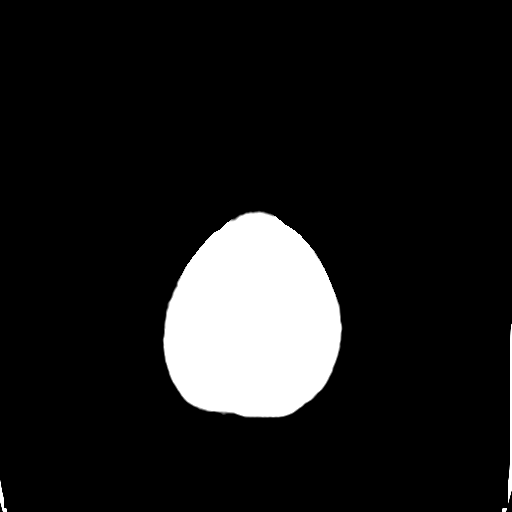

[Series 4: coronal soft tissue · coronal · 0.30mm/px · 3 of 68 slices shown]
[im 26/68  brain]
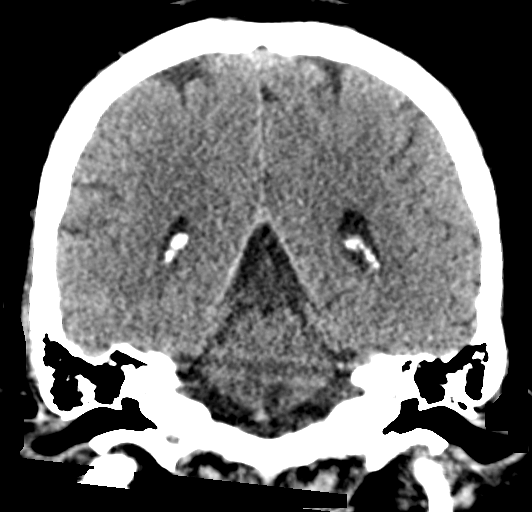
[im 31/68  brain]
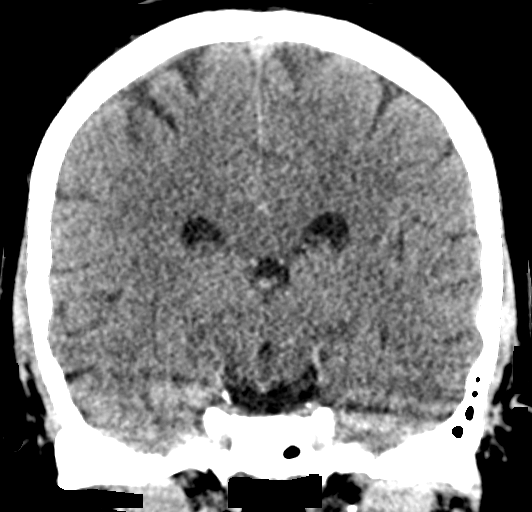
[im 37/68  brain]
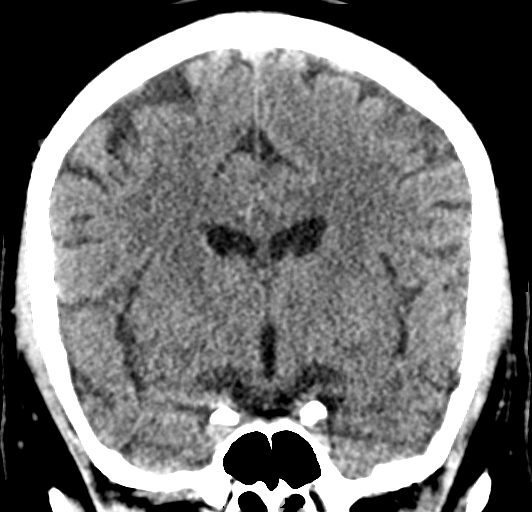

[Series 5: sagittal soft tissue · sagittal · 0.30mm/px · 3 of 54 slices shown]
[im 18/54  brain]
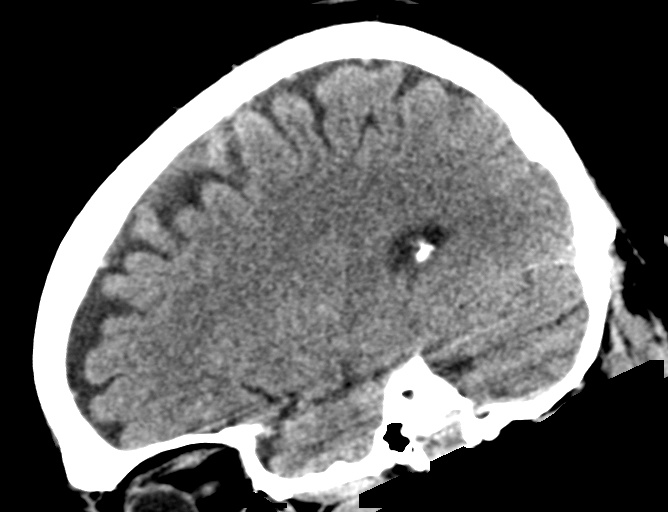
[im 27/54  brain]
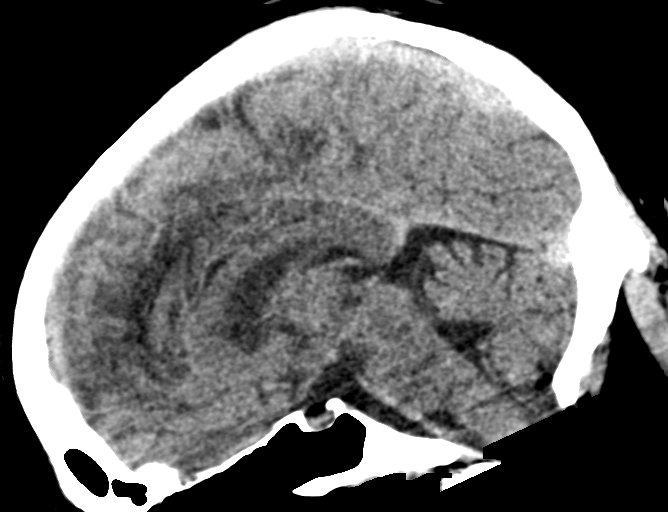
[im 36/54  brain]
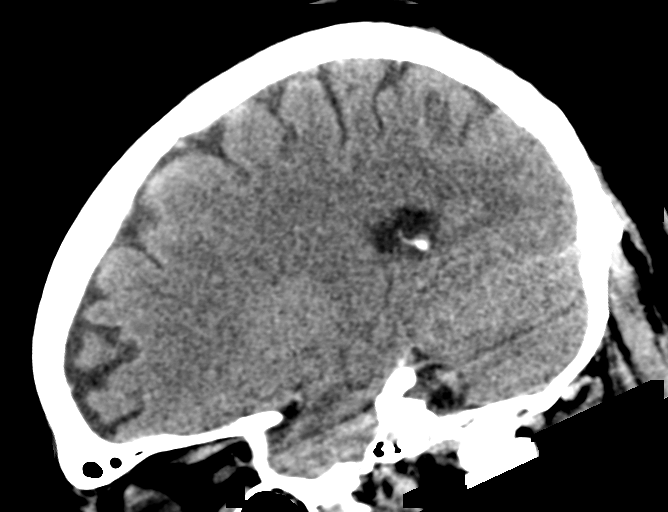

[16 of 47 positions shown; findings below may reference images not displayed]

FINDINGS: Brain: Ventricles, cisterns and other CSF spaces are normal. There
is no mass, mass effect, shift of midline structures or acute
hemorrhage. No evidence of acute infarction.

Vascular: No hyperdense vessel or unexpected calcification.

Skull: Normal. Negative for fracture or focal lesion.

Sinuses/Orbits: No acute finding.

Other: None.
IMPRESSION: Normal head CT.

## 2020-02-22 IMAGING — DX PORTABLE CHEST - 1 VIEW
1 series · 1 of 1 positions shown · non-contrast
Comparison: None.

CLINICAL DATA: Weakness. Dysphagia. Alcohol abuse.

EXAM:
PORTABLE CHEST 1 VIEW

[chest ap]
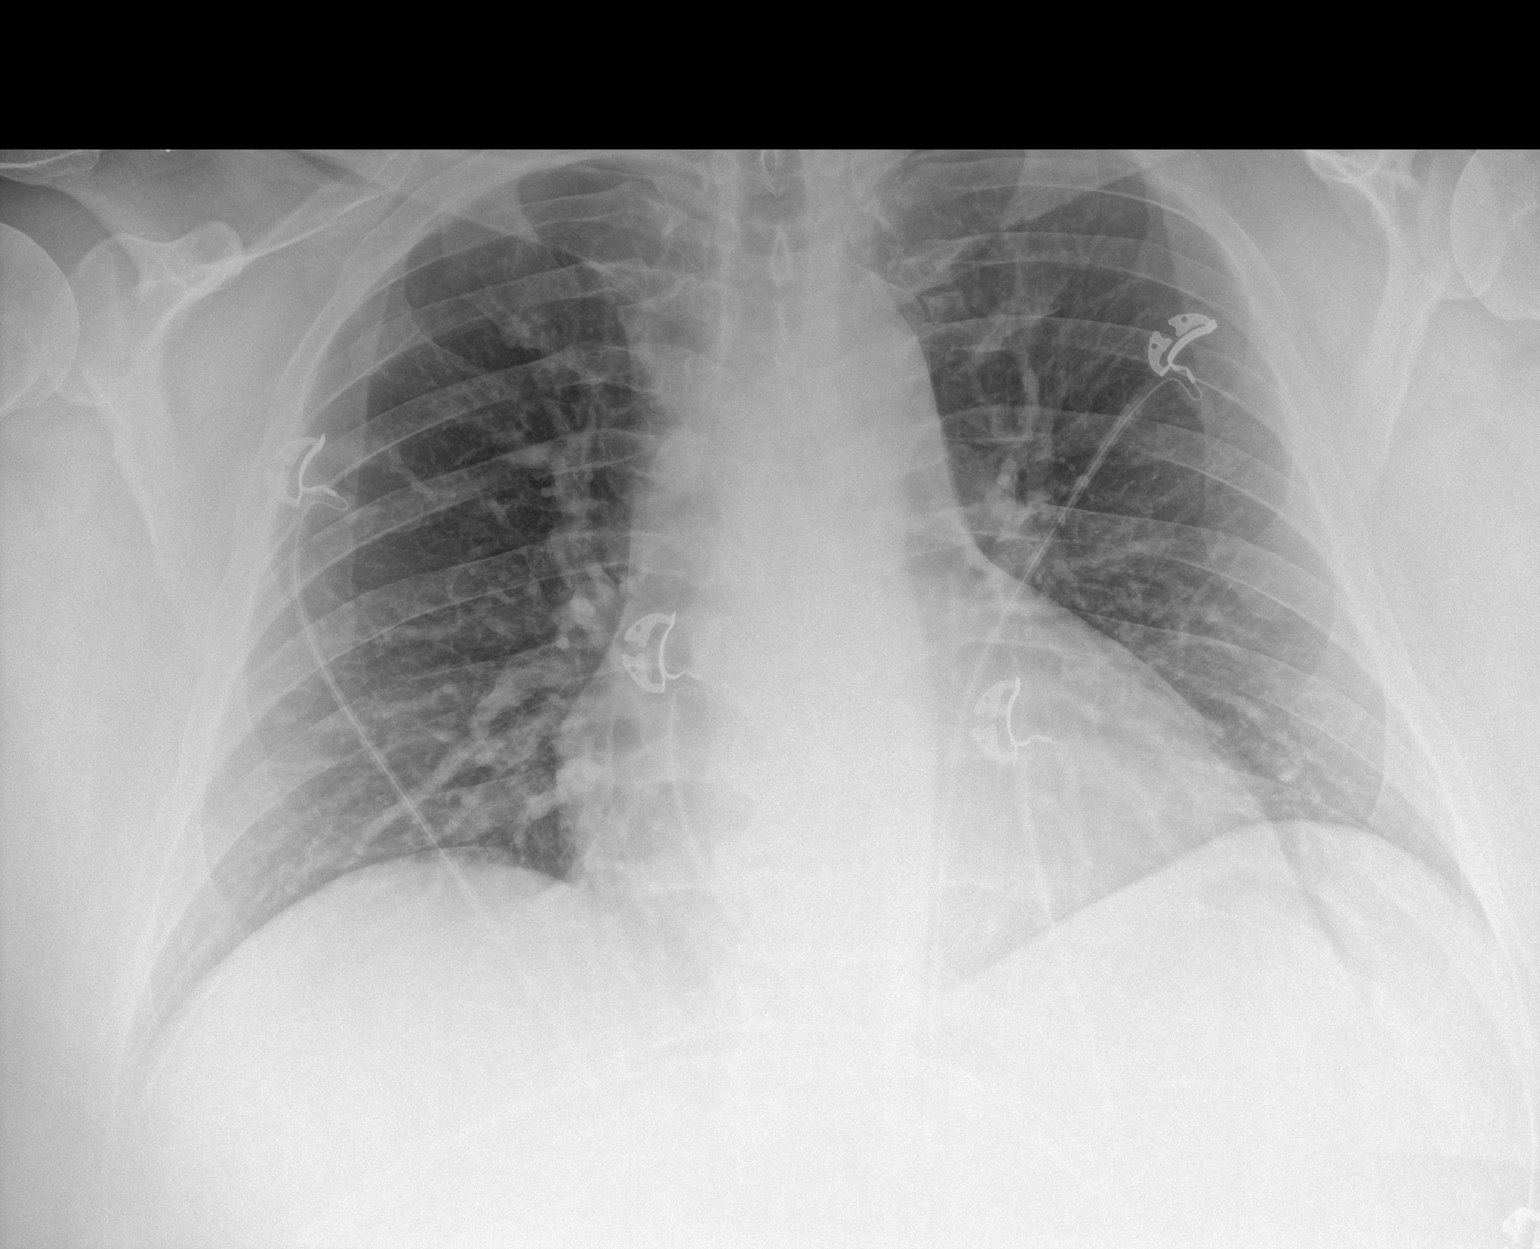

[1 of 1 positions shown; findings below may reference images not displayed]

FINDINGS: Mildly enlarged cardiac silhouette and mildly prominent pulmonary
vasculature. Clear lungs. Thoracic spine degenerative changes.
IMPRESSION: Mild cardiomegaly and mild pulmonary vascular congestion.

## 2021-12-01 ENCOUNTER — Encounter: Payer: Self-pay | Admitting: Family Medicine

## 2021-12-01 ENCOUNTER — Ambulatory Visit (INDEPENDENT_AMBULATORY_CARE_PROVIDER_SITE_OTHER): Payer: Self-pay | Admitting: Family Medicine

## 2021-12-01 ENCOUNTER — Other Ambulatory Visit: Payer: Self-pay

## 2021-12-01 VITALS — BP 159/91 | HR 73 | Temp 98.2°F | Ht 72.0 in | Wt 330.6 lb

## 2021-12-01 DIAGNOSIS — E785 Hyperlipidemia, unspecified: Secondary | ICD-10-CM

## 2021-12-01 DIAGNOSIS — E118 Type 2 diabetes mellitus with unspecified complications: Secondary | ICD-10-CM

## 2021-12-01 DIAGNOSIS — F1021 Alcohol dependence, in remission: Secondary | ICD-10-CM

## 2021-12-01 DIAGNOSIS — F419 Anxiety disorder, unspecified: Secondary | ICD-10-CM

## 2021-12-01 DIAGNOSIS — Z8639 Personal history of other endocrine, nutritional and metabolic disease: Secondary | ICD-10-CM

## 2021-12-01 DIAGNOSIS — E1169 Type 2 diabetes mellitus with other specified complication: Secondary | ICD-10-CM

## 2021-12-01 DIAGNOSIS — I10 Essential (primary) hypertension: Secondary | ICD-10-CM

## 2021-12-01 LAB — MICROALBUMIN, URINE WAIVED
Creatinine, Urine Waived: 200 mg/dL (ref 10–300)
Microalb, Ur Waived: 10 mg/L (ref 0–19)
Microalb/Creat Ratio: <30 mg/g (ref <30)

## 2021-12-01 LAB — BAYER DCA HB A1C WAIVED: HB A1C (BAYER DCA - WAIVED): 5.4 % (ref 4.8–5.6)

## 2021-12-01 MED ORDER — LISINOPRIL 10 MG PO TABS: 10.0000 mg | ORAL_TABLET | Freq: Every day | ORAL | 3 refills | Status: DC

## 2021-12-01 NOTE — Progress Notes (Signed)
BP (!) 159/91    Pulse 73    Temp 98.2 F (36.8 C)    Ht 6' (1.829 m)    Wt (!) 330 lb 9.6 oz (150 kg)    SpO2 99%    BMI 44.84 kg/m    Subjective:    Patient ID: Cory Snyder, male    DOB: [birth year], 46 y.o.   MRN: [id number]  HPI: Cory Snyder is a 46 y.o. male  Chief Complaint  Patient presents with   Hyperlipidemia   Depression   Anxiety   DIABETES Hypoglycemic episodes:no Polydipsia/polyuria: no Visual disturbance: no Chest pain: no Paresthesias: no Glucose Monitoring: no  Accucheck frequency: Not Checking Taking Insulin?: no Blood Pressure Monitoring: not checking Retinal Examination: Not up to Date Foot Exam: Not up to Date Diabetic Education: Not Completed Pneumovax: Up to Date Influenza: Not up to Date Aspirin: no  HYPERTENSION / HYPERLIPIDEMIA- has been off all his medicine for about 2 years Satisfied with current treatment? yes Duration of hypertension: chronic BP monitoring frequency: not checking BP medication side effects: no Past BP meds: metoprolol Duration of hyperlipidemia: chronic Cholesterol medication side effects: not on anything Cholesterol supplements: none Past cholesterol medications: atorvastatin Medication compliance: poor compliance Aspirin: no Recent stressors: yes Recurrent headaches: no Visual changes: no Palpitations: no Dyspnea: no Chest pain: no Lower extremity edema: no Dizzy/lightheaded: no  ANXIETY/DEPRESSION- working with [facility] on his substance abuse and his anxiety. Anxiety has been up a bit.  Duration: chronic Status:exacerbated Anxious mood: yes  Excessive worrying: yes Irritability: yes  Sweating: no Nausea: no Palpitations:no Hyperventilation: no Panic attacks: yes Agoraphobia: no  Obscessions/compulsions: no Depressed mood: no Depression screen [facility] 2/9 12/01/2021 01/17/2019 12/18/2018 09/19/2018  Decreased Interest 1 1 0 0  Down, Depressed, Hopeless 1 0 0 0  PHQ - 2 Score 2 1 0 0  Altered sleeping 1 0 2  3  Tired, decreased energy 1 1 2 1   Change in appetite 1 0 2 1  Feeling bad or failure about yourself  1 0 1 0  Trouble concentrating 1 1 2 1   Moving slowly or fidgety/restless 1 0 2 2  Suicidal thoughts 0 0 0 0  PHQ-9 Score 8 3 11 8   Difficult doing work/chores - Not difficult at all Somewhat difficult Not difficult at all   Anhedonia: no Weight changes: no Insomnia: no   Hypersomnia: no Fatigue/loss of energy: yes Feelings of worthlessness: no Feelings of guilt: no Impaired concentration/indecisiveness: no Suicidal ideations: no  Crying spells: no Recent Stressors/Life Changes: yes   Relationship problems: no   Family stress: yes     Financial stress: no    Job stress: no    Recent death/loss: no  Relevant past medical, surgical, family and social history reviewed and updated as indicated. Interim medical history since our last visit reviewed. Allergies and medications reviewed and updated.  Review of Systems  Constitutional: Negative.   Respiratory: Negative.    Cardiovascular: Negative.   Gastrointestinal: Negative.   Musculoskeletal: Negative.   Neurological: Negative.   Psychiatric/Behavioral: Negative.     Per HPI unless specifically indicated above     Objective:    BP (!) 159/91    Pulse 73    Temp 98.2 F (36.8 C)    Ht 6' (1.829 m)    Wt (!) 330 lb 9.6 oz (150 kg)    SpO2 99%    BMI 44.84 kg/m   Wt Readings from Last 3 Encounters:  12/01/21 Marland Kitchen)  330 lb 9.6 oz (150 kg)  06/18/19 (!) 319 lb (144.7 kg)  05/06/19 300 lb (136.1 kg)    Physical Exam Vitals and nursing note reviewed.  Constitutional:      General: He is not in acute distress.    Appearance: Normal appearance. He is not ill-appearing, toxic-appearing or diaphoretic.  HENT:     Head: Normocephalic and atraumatic.     Right Ear: External ear normal.     Left Ear: External ear normal.     Nose: Nose normal.     Mouth/Throat:     Mouth: Mucous membranes are moist.     Pharynx: Oropharynx  is clear.  Eyes:     General: No scleral icterus.       Right eye: No discharge.        Left eye: No discharge.     Extraocular Movements: Extraocular movements intact.     Conjunctiva/sclera: Conjunctivae normal.     Pupils: Pupils are equal, round, and reactive to light.  Cardiovascular:     Rate and Rhythm: Normal rate and regular rhythm.     Pulses: Normal pulses.     Heart sounds: Murmur heard.    No friction rub. No gallop.  Pulmonary:     Effort: Pulmonary effort is normal. No respiratory distress.     Breath sounds: Normal breath sounds. No stridor. No wheezing, rhonchi or rales.  Chest:     Chest wall: No tenderness.  Musculoskeletal:        General: Normal range of motion.     Cervical back: Normal range of motion and neck supple.  Skin:    General: Skin is warm and dry.     Capillary Refill: Capillary refill takes less than 2 seconds.     Coloration: Skin is not jaundiced or pale.     Findings: No bruising, erythema, lesion or rash.  Neurological:     General: No focal deficit present.     Mental Status: He is alert and oriented to person, place, and time. Mental status is at baseline.  Psychiatric:        Mood and Affect: Mood normal.        Behavior: Behavior normal.        Thought Content: Thought content normal.        Judgment: Judgment normal.    Results for orders placed or performed in visit on 12/01/21  Bayer DCA Hb A1c Waived  Result Value Ref Range   HB A1C (BAYER DCA - WAIVED) 5.4 4.8 - 5.6 %  Microalbumin, Urine Waived  Result Value Ref Range   Microalb, Ur Waived 10 0 - 19 mg/L   Creatinine, Urine Waived 200 10 - 300 mg/dL   Microalb/Creat Ratio <30 <30 mg/g      Assessment & Plan:   Problem List Items Addressed This Visit       Cardiovascular and Mediastinum   HTN (hypertension)    Has been off his medicine for a couple of years. Will start lisinopril and recheck 1 month. Call with any concerns.       Relevant Medications    lisinopril (ZESTRIL) 10 MG tablet   Other Relevant Orders   CBC with Differential/Platelet   Comprehensive metabolic panel   Microalbumin, Urine Waived (Completed)     Endocrine   Hyperlipidemia associated with type 2 diabetes mellitus ([place])    Checking labs today. Await results. Treat as needed.       Relevant Medications  lisinopril (ZESTRIL) 10 MG tablet   Other Relevant Orders   CBC with Differential/Platelet   Comprehensive metabolic panel   Lipid Panel w/o Chol/HDL Ratio   RESOLVED: DM type 2, controlled, with complication ([place]) - Primary    Doing great with A1c of 5.4 off medicine. Likely was high because of his alcohol consumption. Will resolve off problem list. Continue to monitor.       Relevant Medications   lisinopril (ZESTRIL) 10 MG tablet   Other Relevant Orders   CBC with Differential/Platelet   Bayer DCA Hb A1c Waived (Completed)   Comprehensive metabolic panel   Microalbumin, Urine Waived (Completed)     Other   Moderate alcohol use disorder, in early remission ([facility])    Continue to follow with [facility]. Call with any concerns.       Anxiety    Following with [facility]. Will leave medication management to them. Will send them copy of note and labs from today. Await their input.       History of diabetes mellitus    A1c today at 5.4. Will remain off medicine. Continue to monitor.         Follow up plan: Return in about 4 weeks (around 12/29/2021).

## 2021-12-01 NOTE — Assessment & Plan Note (Signed)
Has been off his medicine for a couple of years. Will start lisinopril and recheck 1 month. Call with any concerns.

## 2021-12-01 NOTE — Assessment & Plan Note (Signed)
Checking labs today. Await results. Treat as needed.  

## 2021-12-01 NOTE — Assessment & Plan Note (Signed)
Doing great with A1c of 5.4 off medicine. Likely was high because of his alcohol consumption. Will resolve off problem list. Continue to monitor.

## 2021-12-01 NOTE — Assessment & Plan Note (Signed)
A1c today at 5.4. Will remain off medicine. Continue to monitor.

## 2021-12-01 NOTE — Assessment & Plan Note (Signed)
Continue to follow with [facility]. Call with any concerns.

## 2021-12-01 NOTE — Assessment & Plan Note (Signed)
Following with [facility]. Will leave medication management to them. Will send them copy of note and labs from today. Await their input.

## 2021-12-02 LAB — CBC WITH DIFFERENTIAL/PLATELET
Basophils Absolute: 0.1 10*3/uL (ref 0.0–0.2)
Basos: 1 %
EOS (ABSOLUTE): 0.1 10*3/uL (ref 0.0–0.4)
Eos: 1 %
Hematocrit: 47.9 % (ref 37.5–51.0)
Hemoglobin: 16.6 g/dL (ref 13.0–17.7)
Immature Grans (Abs): 0 10*3/uL (ref 0.0–0.1)
Immature Granulocytes: 0 %
Lymphocytes Absolute: 1.9 10*3/uL (ref 0.7–3.1)
Lymphs: 27 %
MCH: 29.7 pg (ref 26.6–33.0)
MCHC: 34.7 g/dL (ref 31.5–35.7)
MCV: 86 fL (ref 79–97)
Monocytes Absolute: 0.5 10*3/uL (ref 0.1–0.9)
Monocytes: 7 %
Neutrophils Absolute: 4.5 10*3/uL (ref 1.4–7.0)
Neutrophils: 64 %
Platelets: 233 10*3/uL (ref 150–450)
RBC: 5.59 x10E6/uL (ref 4.14–5.80)
RDW: 13.4 % (ref 11.6–15.4)
WBC: 7.1 10*3/uL (ref 3.4–10.8)

## 2021-12-02 LAB — COMPREHENSIVE METABOLIC PANEL
ALT: 20 IU/L (ref 0–44)
AST: 18 IU/L (ref 0–40)
Albumin/Globulin Ratio: 1.4 (ref 1.2–2.2)
Albumin: 4.3 g/dL (ref 4.0–5.0)
Alkaline Phosphatase: 42 IU/L — ABNORMAL LOW (ref 44–121)
BUN/Creatinine Ratio: 17 (ref 9–20)
BUN: 14 mg/dL (ref 6–24)
Bilirubin Total: <0.2 mg/dL (ref 0.0–1.2)
CO2: 18 mmol/L — ABNORMAL LOW (ref 20–29)
Calcium: 9.1 mg/dL (ref 8.7–10.2)
Chloride: 104 mmol/L (ref 96–106)
Creatinine, Ser: 0.83 mg/dL (ref 0.76–1.27)
Globulin, Total: 3 g/dL (ref 1.5–4.5)
Glucose: 118 mg/dL — ABNORMAL HIGH (ref 70–99)
Potassium: 4.4 mmol/L (ref 3.5–5.2)
Sodium: 141 mmol/L (ref 134–144)
Total Protein: 7.3 g/dL (ref 6.0–8.5)
eGFR: 111 mL/min/{1.73_m2} (ref >59)

## 2021-12-02 LAB — LIPID PANEL W/O CHOL/HDL RATIO
Cholesterol, Total: 186 mg/dL (ref 100–199)
HDL: 43 mg/dL (ref >39)
LDL Chol Calc (NIH): 88 mg/dL (ref 0–99)
Triglycerides: 338 mg/dL — ABNORMAL HIGH (ref 0–149)
VLDL Cholesterol Cal: 55 mg/dL — ABNORMAL HIGH (ref 5–40)

## 2022-01-05 ENCOUNTER — Ambulatory Visit: Payer: Self-pay | Admitting: Family Medicine

## 2022-01-18 ENCOUNTER — Ambulatory Visit (INDEPENDENT_AMBULATORY_CARE_PROVIDER_SITE_OTHER): Payer: Self-pay | Admitting: Family Medicine

## 2022-01-18 ENCOUNTER — Encounter: Payer: Self-pay | Admitting: Family Medicine

## 2022-01-18 VITALS — BP 123/74 | HR 58 | Temp 98.5°F | Wt 322.8 lb

## 2022-01-18 DIAGNOSIS — I10 Essential (primary) hypertension: Secondary | ICD-10-CM

## 2022-01-18 MED ORDER — LISINOPRIL 10 MG PO TABS: 10.0000 mg | ORAL_TABLET | Freq: Every day | ORAL | 3 refills | Status: AC

## 2022-01-18 NOTE — Assessment & Plan Note (Signed)
Under good control on current regimen. Continue current regimen. Continue to monitor. Call with any concerns. Refills given. Labs drawn today.   

## 2022-01-18 NOTE — Progress Notes (Signed)
? ?BP 123/74   Pulse (!) 58   Temp 98.5 ?F (36.9 ?C)   Wt (!) 322 lb 12.8 oz (146.4 kg)   SpO2 97%   BMI 43.78 kg/m?   ? ?Subjective:  ? ? Patient ID: Cory Snyder, male    DOB: 02/03/1976, 46 y.o.   MRN: [id number] ? ?HPI: ?Cory Snyder is a 46 y.o. male ? ?Chief Complaint  ?Patient presents with  ? Hypertension  ? ?HYPERTENSION ?Hypertension status: controlled  ?Satisfied with current treatment? yes ?Duration of hypertension: chronic ?BP monitoring frequency:  not checking ?BP range:  ?BP medication side effects:  no ?Medication compliance: excellent compliance ?Previous BP meds: lisinopril ?Aspirin: no ?Recurrent headaches: no ?Visual changes: no ?Palpitations: no ?Dyspnea: no ?Chest pain: no ?Lower extremity edema: no ?Dizzy/lightheaded: no ? ?Relevant past medical, surgical, family and social history reviewed and updated as indicated. Interim medical history since our last visit reviewed. ?Allergies and medications reviewed and updated. ? ?Review of Systems  ?Constitutional: Negative.   ?Respiratory: Negative.    ?Cardiovascular: Negative.   ?Gastrointestinal: Negative.   ?Musculoskeletal: Negative.   ?Psychiatric/Behavioral: Negative.    ? ?Per HPI unless specifically indicated above ? ?   ?Objective:  ?  ?BP 123/74   Pulse (!) 58   Temp 98.5 ?F (36.9 ?C)   Wt (!) 322 lb 12.8 oz (146.4 kg)   SpO2 97%   BMI 43.78 kg/m?   ?Wt Readings from Last 3 Encounters:  ?01/18/22 (!) 322 lb 12.8 oz (146.4 kg)  ?12/01/21 (!) 330 lb 9.6 oz (150 kg)  ?06/18/19 (!) 319 lb (144.7 kg)  ?  ?Physical Exam ?Vitals and nursing note reviewed.  ?Constitutional:   ?   General: He is not in acute distress. ?   Appearance: Normal appearance. He is not ill-appearing, toxic-appearing or diaphoretic.  ?HENT:  ?   Head: Normocephalic and atraumatic.  ?   Right Ear: External ear normal.  ?   Left Ear: External ear normal.  ?   Nose: Nose normal.  ?   Mouth/Throat:  ?   Mouth: Mucous membranes are moist.  ?   Pharynx: Oropharynx is  clear.  ?Eyes:  ?   General: No scleral icterus.    ?   Right eye: No discharge.     ?   Left eye: No discharge.  ?   Extraocular Movements: Extraocular movements intact.  ?   Conjunctiva/sclera: Conjunctivae normal.  ?   Pupils: Pupils are equal, round, and reactive to light.  ?Cardiovascular:  ?   Rate and Rhythm: Normal rate and regular rhythm.  ?   Pulses: Normal pulses.  ?   Heart sounds: Murmur heard.  ?  No friction rub. No gallop.  ?Pulmonary:  ?   Effort: Pulmonary effort is normal. No respiratory distress.  ?   Breath sounds: Normal breath sounds. No stridor. No wheezing, rhonchi or rales.  ?Chest:  ?   Chest wall: No tenderness.  ?Musculoskeletal:     ?   General: Normal range of motion.  ?   Cervical back: Normal range of motion and neck supple.  ?Skin: ?   General: Skin is warm and dry.  ?   Capillary Refill: Capillary refill takes less than 2 seconds.  ?   Coloration: Skin is not jaundiced or pale.  ?   Findings: No bruising, erythema, lesion or rash.  ?Neurological:  ?   General: No focal deficit present.  ?   Mental Status: He is  alert and oriented to person, place, and time. Mental status is at baseline.  ?Psychiatric:     ?   Mood and Affect: Mood normal.     ?   Behavior: Behavior normal.     ?   Thought Content: Thought content normal.     ?   Judgment: Judgment normal.  ? ? ?Results for orders placed or performed in visit on 12/01/21  ?CBC with Differential/Platelet  ?Result Value Ref Range  ? WBC 7.1 3.4 - 10.8 x10E3/uL  ? RBC 5.59 4.14 - 5.80 x10E6/uL  ? Hemoglobin 16.6 13.0 - 17.7 g/dL  ? Hematocrit 47.9 37.5 - 51.0 %  ? MCV 86 79 - 97 fL  ? MCH 29.7 26.6 - 33.0 pg  ? MCHC 34.7 31.5 - 35.7 g/dL  ? RDW 13.4 11.6 - 15.4 %  ? Platelets 233 150 - 450 x10E3/uL  ? Neutrophils 64 Not Estab. %  ? Lymphs 27 Not Estab. %  ? Monocytes 7 Not Estab. %  ? Eos 1 Not Estab. %  ? Basos 1 Not Estab. %  ? Neutrophils Absolute 4.5 1.4 - 7.0 x10E3/uL  ? Lymphocytes Absolute 1.9 0.7 - 3.1 x10E3/uL  ? Monocytes  Absolute 0.5 0.1 - 0.9 x10E3/uL  ? EOS (ABSOLUTE) 0.1 0.0 - 0.4 x10E3/uL  ? Basophils Absolute 0.1 0.0 - 0.2 x10E3/uL  ? Immature Granulocytes 0 Not Estab. %  ? Immature Grans (Abs) 0.0 0.0 - 0.1 x10E3/uL  ?Bayer DCA Hb A1c Waived  ?Result Value Ref Range  ? HB A1C (BAYER DCA - WAIVED) 5.4 4.8 - 5.6 %  ?Comprehensive metabolic panel  ?Result Value Ref Range  ? Glucose 118 (H) 70 - 99 mg/dL  ? BUN 14 6 - 24 mg/dL  ? Creatinine, Ser 0.83 0.76 - 1.27 mg/dL  ? eGFR 111 >59 mL/min/1.73  ? BUN/Creatinine Ratio 17 9 - 20  ? Sodium 141 134 - 144 mmol/L  ? Potassium 4.4 3.5 - 5.2 mmol/L  ? Chloride 104 96 - 106 mmol/L  ? CO2 18 (L) 20 - 29 mmol/L  ? Calcium 9.1 8.7 - 10.2 mg/dL  ? Total Protein 7.3 6.0 - 8.5 g/dL  ? Albumin 4.3 4.0 - 5.0 g/dL  ? Globulin, Total 3.0 1.5 - 4.5 g/dL  ? Albumin/Globulin Ratio 1.4 1.2 - 2.2  ? Bilirubin Total <0.2 0.0 - 1.2 mg/dL  ? Alkaline Phosphatase 42 (L) 44 - 121 IU/L  ? AST 18 0 - 40 IU/L  ? ALT 20 0 - 44 IU/L  ?Lipid Panel w/o Chol/HDL Ratio  ?Result Value Ref Range  ? Cholesterol, Total 186 100 - 199 mg/dL  ? Triglycerides 338 (H) 0 - 149 mg/dL  ? HDL 43 >39 mg/dL  ? VLDL Cholesterol Cal 55 (H) 5 - 40 mg/dL  ? LDL Chol Calc (NIH) 88 0 - 99 mg/dL  ?Microalbumin, Urine Waived  ?Result Value Ref Range  ? Microalb, Ur Waived 10 0 - 19 mg/L  ? Creatinine, Urine Waived 200 10 - 300 mg/dL  ? Microalb/Creat Ratio <30 <30 mg/g  ? ?   ?Assessment & Plan:  ? ?Problem List Items Addressed This Visit   ? ?  ? Cardiovascular and Mediastinum  ? HTN (hypertension) - Primary  ?  Under good control on current regimen. Continue current regimen. Continue to monitor. Call with any concerns. Refills given. Labs drawn today. ? ?  ?  ? Relevant Medications  ? lisinopril (ZESTRIL) 10 MG tablet  ?  Other Relevant Orders  ? Basic metabolic panel  ?  ? ?Follow up plan: ?Return in about 6 months (around 07/20/2022) for physical if he has insurance, otherwise just follow up. ? ? ? ? ? ?

## 2022-01-19 LAB — BASIC METABOLIC PANEL
BUN/Creatinine Ratio: 14 (ref 9–20)
BUN: 11 mg/dL (ref 6–24)
CO2: 21 mmol/L (ref 20–29)
Calcium: 9.2 mg/dL (ref 8.7–10.2)
Chloride: 106 mmol/L (ref 96–106)
Creatinine, Ser: 0.76 mg/dL (ref 0.76–1.27)
Glucose: 108 mg/dL — ABNORMAL HIGH (ref 70–99)
Potassium: 4.1 mmol/L (ref 3.5–5.2)
Sodium: 144 mmol/L (ref 134–144)
eGFR: 113 mL/min/{1.73_m2} (ref >59)

## 2022-05-07 ENCOUNTER — Ambulatory Visit: Payer: Self-pay | Admitting: Family Medicine

## 2022-05-07 ENCOUNTER — Ambulatory Visit: Payer: Self-pay

## 2022-05-07 ENCOUNTER — Ambulatory Visit: Payer: Self-pay | Admitting: Nurse Practitioner

## 2022-05-07 ENCOUNTER — Encounter: Payer: Self-pay | Admitting: Nurse Practitioner

## 2022-05-07 VITALS — BP 120/77 | HR 62 | Temp 98.5°F | Ht 72.0 in | Wt 319.0 lb

## 2022-05-07 DIAGNOSIS — L03031 Cellulitis of right toe: Secondary | ICD-10-CM | POA: Insufficient documentation

## 2022-05-07 DIAGNOSIS — T63461A Toxic effect of venom of wasps, accidental (unintentional), initial encounter: Secondary | ICD-10-CM

## 2022-05-07 MED ORDER — DOXYCYCLINE HYCLATE 100 MG PO TABS: 100.0000 mg | ORAL_TABLET | Freq: Two times a day (BID) | ORAL | 0 refills | Status: AC

## 2022-05-07 MED ORDER — PREDNISONE 20 MG PO TABS: 40.0000 mg | ORAL_TABLET | Freq: Every day | ORAL | 0 refills | Status: AC

## 2022-05-07 NOTE — Assessment & Plan Note (Signed)
Acute, present for 4 days.  At this time due to risks with diabetes will start Doxycycline 100 MG BID for 7 days and send in a short burst of Prednisone 40 MG daily for 5 days, alerted him this may push sugars up for a few days and to monitor.  Recommend continue ice and Tylenol at home + also take a Zyrtec.  He does not wish to schedule follow-up, but is aware to return if no improvement or worsening.  He will mark redness and alert if no reduction in this.

## 2022-05-07 NOTE — Progress Notes (Signed)
BP 120/77   Pulse 62   Temp 98.5 F (36.9 C) (Oral)   Ht 6' (1.829 m)   Wt (!) 319 lb (144.7 kg)   SpO2 98%   BMI 43.26 kg/m    Subjective:    Patient ID: Cory Snyder, male    DOB: Aug 10, 1976, 46 y.o.   MRN: [id number]  HPI: Cory Snyder is a 46 y.o. male  Chief Complaint  Patient presents with   Insect Bite    Patient says on Monday he was stung by yellow jackets and says his foot has gotten worse. Patient says his sister works in an office and says he needed to have it looked at.    BEE STING Was stung on Monday to foot by yellow jackets.  Works in [employer] and is on feet 10-12 hours, having more swelling at this time.  Took Extra Strength Tylenol last night. Last A1c 5.4% in February. Duration: days Location: right foot Pain:  mild with swelling Severity: mild Redness: yes Swelling: yes Oozing: no Pus: no Fevers: no Nausea/vomiting: no Status: fluctuating Treatments attempted: ice and Tylenol ES  Relevant past medical, surgical, family and social history reviewed and updated as indicated. Interim medical history since our last visit reviewed. Allergies and medications reviewed and updated.  Review of Systems  Constitutional:  Negative for activity change, diaphoresis, fatigue and fever.  Respiratory:  Negative for cough, chest tightness, shortness of breath and wheezing.   Cardiovascular:  Negative for chest pain, palpitations and leg swelling.  Skin:  Positive for wound.  Neurological: Negative.   Psychiatric/Behavioral: Negative.      Per HPI unless specifically indicated above     Objective:    BP 120/77   Pulse 62   Temp 98.5 F (36.9 C) (Oral)   Ht 6' (1.829 m)   Wt (!) 319 lb (144.7 kg)   SpO2 98%   BMI 43.26 kg/m   Wt Readings from Last 3 Encounters:  05/07/22 (!) 319 lb (144.7 kg)  01/18/22 (!) 322 lb 12.8 oz (146.4 kg)  12/01/21 (!) 330 lb 9.6 oz (150 kg)    Physical Exam Vitals and nursing note reviewed.  Constitutional:       General: He is awake. He is not in acute distress.    Appearance: He is well-developed and well-groomed. He is obese. He is not ill-appearing or toxic-appearing.  HENT:     Head: Normocephalic and atraumatic.     Right Ear: Hearing normal. No drainage.     Left Ear: Hearing normal. No drainage.  Eyes:     General: Lids are normal.        Right eye: No discharge.        Left eye: No discharge.     Conjunctiva/sclera: Conjunctivae normal.     Pupils: Pupils are equal, round, and reactive to light.  Neck:     Thyroid: No thyromegaly.     Vascular: No carotid bruit.  Cardiovascular:     Rate and Rhythm: Normal rate and regular rhythm.     Heart sounds: Normal heart sounds, S1 normal and S2 normal. No murmur heard.    No gallop.  Pulmonary:     Effort: Pulmonary effort is normal. No accessory muscle usage or respiratory distress.     Breath sounds: Normal breath sounds.  Abdominal:     General: Bowel sounds are normal.     Palpations: Abdomen is soft. There is no hepatomegaly or splenomegaly.  Musculoskeletal:  General: Normal range of motion.     Cervical back: Normal range of motion and neck supple.     Right lower leg: No edema.     Left lower leg: No edema.  Skin:    General: Skin is warm and dry.     Capillary Refill: Capillary refill takes less than 2 seconds.     Findings: Wound (wasp stings to right foot) present.       Neurological:     Mental Status: He is alert and oriented to person, place, and time.     Deep Tendon Reflexes: Reflexes are normal and symmetric.  Psychiatric:        Attention and Perception: Attention normal.        Mood and Affect: Mood normal.        Speech: Speech normal.        Behavior: Behavior normal. Behavior is cooperative.        Thought Content: Thought content normal.     Results for orders placed or performed in visit on 16/94/50  Basic metabolic panel  Result Value Ref Range   Glucose 108 (H) 70 - 99 mg/dL   BUN 11 6 - 24  mg/dL   Creatinine, Ser 0.76 0.76 - 1.27 mg/dL   eGFR 113 >59 mL/min/1.73   BUN/Creatinine Ratio 14 9 - 20   Sodium 144 134 - 144 mmol/L   Potassium 4.1 3.5 - 5.2 mmol/L   Chloride 106 96 - 106 mmol/L   CO2 21 20 - 29 mmol/L   Calcium 9.2 8.7 - 10.2 mg/dL      Assessment & Plan:   Problem List Items Addressed This Visit       Other   Cellulitis of toe of right foot - Primary    Acute, present for 4 days.  At this time due to risks with diabetes will start Doxycycline 100 MG BID for 7 days and send in a short burst of Prednisone 40 MG daily for 5 days, alerted him this may push sugars up for a few days and to monitor.  Recommend continue ice and Tylenol at home + also take a Zyrtec.  He does not wish to schedule follow-up, but is aware to return if no improvement or worsening.  He will mark redness and alert if no reduction in this.      Relevant Medications   predniSONE (DELTASONE) 20 MG tablet   doxycycline (VIBRA-TABS) 100 MG tablet   Yellow jacket sting    Refer to cellulitis plan of care.      Relevant Medications   predniSONE (DELTASONE) 20 MG tablet   doxycycline (VIBRA-TABS) 100 MG tablet     Follow up plan: Return if symptoms worsen or fail to improve.

## 2022-05-07 NOTE — Assessment & Plan Note (Signed)
Refer to cellulitis plan of care. 

## 2022-05-07 NOTE — Telephone Encounter (Signed)
  Chief Complaint: bee sting- Symptoms: redness and swelling to right lower leg(ankle) now spreading down to right foot Frequency: Monday Pertinent Negatives: Patient denies face, tongue swelling, vomiting, new rash, and. pain Disposition: [] ED /[] Urgent Care (no appt availability in office) / [] Appointment(In office/virtual)/ []  [facility]/ [] Home Care/ [] Refused Recommended Disposition /[] [facility]/ []  Follow-up with PCP Additional Notes: Pt given appt today in office. Advised to draw along the margins of redness to watch if redness continues to spread- pt verbalized understanding.    Reason for Disposition  Swelling is huge (e.g., more than 4 inches or 10 cm, spreads beyond wrist or ankle)  Answer Assessment - Initial Assessment Questions 1. TYPE: "What type of sting was it?" (bee, yellow jacket, etc.)      Yellow jacket 2. ONSET: "When did it occur?"      Monday 3. LOCATION: "Where is the sting located?"  "How many stings?"     Right back of lower leg (ankle)now spreading to right to foot 4. SWELLING SIZE: "How big is the swelling?" (e.g., inches or cm)     Foot swollen right foot 5. REDNESS: "Is the area red or pink?" If Yes, ask: "What size is area of redness?" (e.g., inches or cm). "When did the redness start?"     Wednesday redness- very red to purplish - size 6 inches by 3 inches wide 6. PAIN: "Is there any pain?" If Yes, ask: "How bad is it?"  (Scale 1-10; or mild, moderate, severe)     no 7. ITCHING: "Is there any itching?" If Yes, ask: "How bad is it?"      Yes -severe 8. RESPIRATORY DISTRESS: "Describe your breathing."     normal 9. PRIOR REACTIONS: "Have you had any severe allergic reactions to stings in the past?" if yes, ask: "What happened?"     Yes- reactions to to all bites- calamine  10. OTHER SYMPTOMS: "Do you have any other symptoms?" (e.g., abdomen pain, face or tongue swelling, new rash elsewhere, vomiting)       no  Protocols used:  Bee or Yellow Jacket Sting-A-AH

## 2022-05-07 NOTE — Patient Instructions (Signed)
Cellulitis, Adult  Cellulitis is a skin infection. The infected area is often warm, red, swollen, and sore. It occurs most often in the arms and lower legs. It is very important to get treated for this condition. What are the causes? This condition is caused by bacteria. The bacteria enter through a break in the skin, such as a cut, burn, insect bite, open sore, or crack. What increases the risk? This condition is more likely to occur in people who: Have a weak body defense system (immune system). Have open cuts, burns, bites, or scrapes on the skin. Are older than 45 years of age. Have a blood sugar problem (diabetes). Have a long-lasting (chronic) liver disease (cirrhosis) or kidney disease. Are very overweight (obese). Have a skin problem, such as: Itchy rash (eczema). Slow movement of blood in the veins (venous stasis). Fluid buildup below the skin (edema). Have been treated with high-energy rays (radiation). Use IV drugs. What are the signs or symptoms? Symptoms of this condition include: Skin that is: Red. Streaking. Spotting. Swollen. Sore or painful when you touch it. Warm. A fever. Chills. Blisters. How is this diagnosed? This condition is diagnosed based on: Medical history. Physical exam. Blood tests. Imaging tests. How is this treated? Treatment for this condition may include: Medicines to treat infections or allergies. Home care, such as: Rest. Placing cold or warm cloths (compresses) on the skin. Hospital care, if the condition is very bad. Follow these instructions at home: Medicines Take over-the-counter and prescription medicines only as told by your doctor. If you were prescribed an antibiotic medicine, take it as told by your doctor. Do not stop taking it even if you start to feel better. General instructions  Drink enough fluid to keep your pee (urine) pale yellow. Do not touch or rub the infected area. Raise (elevate) the infected area above  the level of your heart while you are sitting or lying down. Place cold or warm cloths on the area as told by your doctor. Keep all follow-up visits as told by your doctor. This is important. Contact a doctor if: You have a fever. You do not start to get better after 1-2 days of treatment. Your bone or joint under the infected area starts to hurt after the skin has healed. Your infection comes back. This can happen in the same area or another area. You have a swollen bump in the area. You have new symptoms. You feel ill and have muscle aches and pains. Get help right away if: Your symptoms get worse. You feel very sleepy. You throw up (vomit) or have watery poop (diarrhea) for a long time. You see red streaks coming from the area. Your red area gets larger. Your red area turns dark in color. These symptoms may represent a serious problem that is an emergency. Do not wait to see if the symptoms will go away. Get medical help right away. Call your local emergency services (911 in the [place] Do not drive yourself to the hospital. Summary Cellulitis is a skin infection. The area is often warm, red, swollen, and sore. This condition is treated with medicines, rest, and cold and warm cloths. Take all medicines only as told by your doctor. Tell your doctor if symptoms do not start to get better after 1-2 days of treatment. This information is not intended to replace advice given to you by your health care provider. Make sure you discuss any questions you have with your health care provider. Document Revised: 07/16/2021 Document   Reviewed: 07/16/2021 [employer] Patient Education  2023 [employer]  

## 2022-07-20 ENCOUNTER — Ambulatory Visit: Payer: Self-pay | Admitting: Family Medicine

## 2022-08-02 ENCOUNTER — Ambulatory Visit: Payer: Self-pay | Admitting: Family Medicine
